# Patient Record
Sex: Male | Born: 1940 | Race: Black or African American | Hispanic: No | Marital: Single | Smoking: Never smoker
Health system: Southern US, Community
[De-identification: ages and names within clinical notes are randomized; demographics above are authoritative.]

## PROBLEM LIST (undated history)

## (undated) DIAGNOSIS — E872 Acidosis: Secondary | ICD-10-CM

## (undated) DIAGNOSIS — F1010 Alcohol abuse, uncomplicated: Secondary | ICD-10-CM

## (undated) DIAGNOSIS — I10 Essential (primary) hypertension: Secondary | ICD-10-CM

## (undated) DIAGNOSIS — E785 Hyperlipidemia, unspecified: Secondary | ICD-10-CM

## (undated) DIAGNOSIS — Z992 Dependence on renal dialysis: Secondary | ICD-10-CM

## (undated) DIAGNOSIS — Z933 Colostomy status: Secondary | ICD-10-CM

## (undated) DIAGNOSIS — K56609 Unspecified intestinal obstruction, unspecified as to partial versus complete obstruction: Secondary | ICD-10-CM

## (undated) DIAGNOSIS — K861 Other chronic pancreatitis: Secondary | ICD-10-CM

## (undated) HISTORY — DX: Morbid (severe) obesity due to excess calories: E66.01

## (undated) HISTORY — DX: Essential (primary) hypertension: I10

## (undated) HISTORY — PX: OTHER SURGICAL HISTORY: SHX169

## (undated) HISTORY — DX: Other chronic pancreatitis: K86.1

## (undated) HISTORY — PX: COLECTOMY: SHX59

## (undated) HISTORY — DX: Unspecified intestinal obstruction, unspecified as to partial versus complete obstruction: K56.609

## (undated) HISTORY — DX: Alcohol abuse, uncomplicated: F10.10

## (undated) HISTORY — DX: Colostomy status: Z93.3

## (undated) HISTORY — DX: Hyperlipidemia, unspecified: E78.5

---

## 2000-12-28 ENCOUNTER — Inpatient Hospital Stay (HOSPITAL_COMMUNITY): Admission: AD | Admit: 2000-12-28 | Discharge: 2001-01-24 | Payer: Self-pay | Admitting: Family Medicine

## 2000-12-29 ENCOUNTER — Encounter: Payer: Self-pay | Admitting: Family Medicine

## 2000-12-30 ENCOUNTER — Encounter: Payer: Self-pay | Admitting: General Surgery

## 2001-01-06 ENCOUNTER — Encounter: Payer: Self-pay | Admitting: Family Medicine

## 2001-01-10 ENCOUNTER — Encounter: Payer: Self-pay | Admitting: Family Medicine

## 2001-01-11 ENCOUNTER — Encounter: Payer: Self-pay | Admitting: General Surgery

## 2001-01-16 ENCOUNTER — Encounter: Payer: Self-pay | Admitting: Internal Medicine

## 2004-07-31 ENCOUNTER — Ambulatory Visit: Payer: Self-pay | Admitting: Family Medicine

## 2004-08-19 ENCOUNTER — Ambulatory Visit: Payer: Self-pay | Admitting: Family Medicine

## 2004-11-27 ENCOUNTER — Ambulatory Visit: Payer: Self-pay | Admitting: Family Medicine

## 2005-01-26 ENCOUNTER — Ambulatory Visit: Payer: Self-pay | Admitting: Family Medicine

## 2005-05-07 ENCOUNTER — Inpatient Hospital Stay (HOSPITAL_COMMUNITY): Admission: EM | Admit: 2005-05-07 | Discharge: 2005-05-13 | Payer: Self-pay | Admitting: Emergency Medicine

## 2005-05-07 ENCOUNTER — Ambulatory Visit: Payer: Self-pay | Admitting: *Deleted

## 2005-05-12 ENCOUNTER — Encounter (INDEPENDENT_AMBULATORY_CARE_PROVIDER_SITE_OTHER): Payer: Self-pay | Admitting: General Surgery

## 2005-06-11 ENCOUNTER — Ambulatory Visit: Payer: Self-pay | Admitting: Family Medicine

## 2005-09-14 ENCOUNTER — Ambulatory Visit: Payer: Self-pay | Admitting: Family Medicine

## 2005-10-19 ENCOUNTER — Ambulatory Visit: Payer: Self-pay | Admitting: Family Medicine

## 2006-02-11 ENCOUNTER — Ambulatory Visit: Payer: Self-pay | Admitting: Family Medicine

## 2006-05-26 ENCOUNTER — Ambulatory Visit: Payer: Self-pay | Admitting: Family Medicine

## 2006-06-30 ENCOUNTER — Ambulatory Visit: Payer: Self-pay | Admitting: Family Medicine

## 2006-08-11 ENCOUNTER — Ambulatory Visit: Payer: Self-pay | Admitting: Family Medicine

## 2006-09-08 ENCOUNTER — Encounter: Payer: Self-pay | Admitting: Family Medicine

## 2006-09-08 LAB — CONVERTED CEMR LAB
ALT: 11 units/L (ref 0–53)
AST: 12 units/L (ref 0–37)
Albumin: 4.3 g/dL (ref 3.5–5.2)
Alkaline Phosphatase: 37 units/L — ABNORMAL LOW (ref 39–117)
BUN: 28 mg/dL — ABNORMAL HIGH (ref 6–23)
Basophils Absolute: 0 10*3/uL (ref 0.0–0.1)
Basophils Relative: 0 % (ref 0–1)
Bilirubin, Direct: 0.1 mg/dL (ref 0.0–0.3)
CO2: 18 meq/L — ABNORMAL LOW (ref 19–32)
Calcium: 9 mg/dL (ref 8.4–10.5)
Chloride: 109 meq/L (ref 96–112)
Cholesterol: 174 mg/dL (ref 0–200)
Creatinine, Ser: 1.79 mg/dL — ABNORMAL HIGH (ref 0.40–1.50)
Eosinophils Absolute: 0.1 10*3/uL (ref 0.0–0.7)
Eosinophils Relative: 2 % (ref 0–5)
Glucose, Bld: 95 mg/dL (ref 70–99)
HCT: 34.9 % — ABNORMAL LOW (ref 39.0–52.0)
HDL: 51 mg/dL (ref >39)
Hemoglobin: 11.1 g/dL — ABNORMAL LOW (ref 13.0–17.0)
Indirect Bilirubin: 0.4 mg/dL (ref 0.0–0.9)
LDL Cholesterol: 93 mg/dL (ref 0–99)
Lymphocytes Relative: 29 % (ref 12–46)
Lymphs Abs: 1.7 10*3/uL (ref 0.7–3.3)
MCHC: 31.8 g/dL (ref 30.0–36.0)
MCV: 81.4 fL (ref 78.0–100.0)
Monocytes Absolute: 0.6 10*3/uL (ref 0.2–0.7)
Monocytes Relative: 10 % (ref 3–11)
Neutro Abs: 3.4 10*3/uL (ref 1.7–7.7)
Neutrophils Relative %: 59 % (ref 43–77)
Platelets: 256 10*3/uL (ref 150–400)
Potassium: 5.8 meq/L — ABNORMAL HIGH (ref 3.5–5.3)
RBC: 4.29 M/uL (ref 4.22–5.81)
RDW: 16.2 % — ABNORMAL HIGH (ref 11.5–14.0)
Sodium: 139 meq/L (ref 135–145)
TSH: 2.931 microintl units/mL (ref 0.350–5.50)
Total Bilirubin: 0.5 mg/dL (ref 0.3–1.2)
Total CHOL/HDL Ratio: 3.4
Total Protein: 7.1 g/dL (ref 6.0–8.3)
Triglycerides: 150 mg/dL — ABNORMAL HIGH (ref <150)
VLDL: 30 mg/dL (ref 0–40)
WBC: 5.8 10*3/uL (ref 4.0–10.5)

## 2006-12-06 ENCOUNTER — Ambulatory Visit: Payer: Self-pay | Admitting: Family Medicine

## 2006-12-06 LAB — CONVERTED CEMR LAB
ALT: 12 units/L (ref 0–53)
AST: 15 units/L (ref 0–37)
Albumin: 4.4 g/dL (ref 3.5–5.2)
Alkaline Phosphatase: 36 units/L — ABNORMAL LOW (ref 39–117)
BUN: 23 mg/dL (ref 6–23)
Basophils Absolute: 0 10*3/uL (ref 0.0–0.1)
Basophils Relative: 0 % (ref 0–1)
Bilirubin, Direct: 0.1 mg/dL (ref 0.0–0.3)
CO2: 20 meq/L (ref 19–32)
Calcium: 9.2 mg/dL (ref 8.4–10.5)
Chloride: 105 meq/L (ref 96–112)
Cholesterol: 210 mg/dL — ABNORMAL HIGH (ref 0–200)
Creatinine, Ser: 1.6 mg/dL — ABNORMAL HIGH (ref 0.40–1.50)
Eosinophils Absolute: 0.1 10*3/uL (ref 0.0–0.7)
Eosinophils Relative: 2 % (ref 0–5)
Glucose, Bld: 101 mg/dL — ABNORMAL HIGH (ref 70–99)
HCT: 35.1 % — ABNORMAL LOW (ref 39.0–52.0)
HDL: 57 mg/dL (ref >39)
Hemoglobin: 11.4 g/dL — ABNORMAL LOW (ref 13.0–17.0)
Indirect Bilirubin: 0.4 mg/dL (ref 0.0–0.9)
LDL Cholesterol: 115 mg/dL — ABNORMAL HIGH (ref 0–99)
Lymphocytes Relative: 20 % (ref 12–46)
Lymphs Abs: 1.2 10*3/uL (ref 0.7–3.3)
MCHC: 32.5 g/dL (ref 30.0–36.0)
MCV: 81.6 fL (ref 78.0–100.0)
Monocytes Absolute: 0.5 10*3/uL (ref 0.2–0.7)
Monocytes Relative: 9 % (ref 3–11)
Neutro Abs: 3.9 10*3/uL (ref 1.7–7.7)
Neutrophils Relative %: 69 % (ref 43–77)
Platelets: 234 10*3/uL (ref 150–400)
Potassium: 4.5 meq/L (ref 3.5–5.3)
RBC: 4.3 M/uL (ref 4.22–5.81)
RDW: 16.1 % — ABNORMAL HIGH (ref 11.5–14.0)
Sodium: 137 meq/L (ref 135–145)
TSH: 3.527 microintl units/mL (ref 0.350–5.50)
Total Bilirubin: 0.5 mg/dL (ref 0.3–1.2)
Total CHOL/HDL Ratio: 3.7
Total Protein: 7.3 g/dL (ref 6.0–8.3)
Triglycerides: 190 mg/dL — ABNORMAL HIGH (ref <150)
VLDL: 38 mg/dL (ref 0–40)
WBC: 5.6 10*3/uL (ref 4.0–10.5)

## 2006-12-07 ENCOUNTER — Ambulatory Visit (HOSPITAL_COMMUNITY): Admission: RE | Admit: 2006-12-07 | Discharge: 2006-12-07 | Payer: Self-pay | Admitting: Family Medicine

## 2007-03-24 ENCOUNTER — Ambulatory Visit: Payer: Self-pay | Admitting: Family Medicine

## 2007-05-24 ENCOUNTER — Encounter: Payer: Self-pay | Admitting: Family Medicine

## 2007-05-24 LAB — CONVERTED CEMR LAB
ALT: 8 units/L (ref 0–53)
AST: 12 units/L (ref 0–37)
Albumin: 4.3 g/dL (ref 3.5–5.2)
Alkaline Phosphatase: 36 units/L — ABNORMAL LOW (ref 39–117)
Bilirubin, Direct: 0.1 mg/dL (ref 0.0–0.3)
Cholesterol: 162 mg/dL (ref 0–200)
HDL: 44 mg/dL (ref >39)
Indirect Bilirubin: 0.7 mg/dL (ref 0.0–0.9)
LDL Cholesterol: 78 mg/dL (ref 0–99)
Total Bilirubin: 0.8 mg/dL (ref 0.3–1.2)
Total CHOL/HDL Ratio: 3.7
Total Protein: 7.2 g/dL (ref 6.0–8.3)
Triglycerides: 200 mg/dL — ABNORMAL HIGH (ref <150)
VLDL: 40 mg/dL (ref 0–40)

## 2007-06-01 ENCOUNTER — Ambulatory Visit: Payer: Self-pay | Admitting: Family Medicine

## 2007-07-23 ENCOUNTER — Inpatient Hospital Stay (HOSPITAL_COMMUNITY): Admission: EM | Admit: 2007-07-23 | Discharge: 2007-07-26 | Payer: Self-pay | Admitting: Emergency Medicine

## 2007-08-01 ENCOUNTER — Inpatient Hospital Stay (HOSPITAL_COMMUNITY): Admission: EM | Admit: 2007-08-01 | Discharge: 2007-08-05 | Payer: Self-pay | Admitting: Emergency Medicine

## 2007-08-04 ENCOUNTER — Encounter: Payer: Self-pay | Admitting: Family Medicine

## 2007-08-31 ENCOUNTER — Ambulatory Visit: Payer: Self-pay | Admitting: Family Medicine

## 2007-08-31 LAB — CONVERTED CEMR LAB
BUN: 22 mg/dL (ref 6–23)
CO2: 19 meq/L (ref 19–32)
Calcium: 9.5 mg/dL (ref 8.4–10.5)
Chloride: 108 meq/L (ref 96–112)
Creatinine, Ser: 1.6 mg/dL — ABNORMAL HIGH (ref 0.40–1.50)
Glucose, Bld: 102 mg/dL — ABNORMAL HIGH (ref 70–99)
PSA: 1.13 ng/mL (ref 0.10–4.00)
Potassium: 5.3 meq/L (ref 3.5–5.3)
Sodium: 140 meq/L (ref 135–145)

## 2007-10-31 ENCOUNTER — Ambulatory Visit: Payer: Self-pay | Admitting: Family Medicine

## 2007-10-31 LAB — CONVERTED CEMR LAB
ALT: 8 units/L (ref 0–53)
AST: 10 units/L (ref 0–37)
Albumin: 4.3 g/dL (ref 3.5–5.2)
Alkaline Phosphatase: 40 units/L (ref 39–117)
BUN: 29 mg/dL — ABNORMAL HIGH (ref 6–23)
Basophils Absolute: 0 10*3/uL (ref 0.0–0.1)
Basophils Relative: 0 % (ref 0–1)
Bilirubin, Direct: 0.1 mg/dL (ref 0.0–0.3)
CO2: 18 meq/L — ABNORMAL LOW (ref 19–32)
Calcium: 9.2 mg/dL (ref 8.4–10.5)
Chloride: 107 meq/L (ref 96–112)
Cholesterol: 206 mg/dL — ABNORMAL HIGH (ref 0–200)
Creatinine, Ser: 1.92 mg/dL — ABNORMAL HIGH (ref 0.40–1.50)
Eosinophils Absolute: 0.1 10*3/uL (ref 0.0–0.7)
Eosinophils Relative: 2 % (ref 0–5)
Glucose, Bld: 102 mg/dL — ABNORMAL HIGH (ref 70–99)
HCT: 34.5 % — ABNORMAL LOW (ref 39.0–52.0)
HDL: 41 mg/dL (ref >39)
Hemoglobin: 11.3 g/dL — ABNORMAL LOW (ref 13.0–17.0)
Indirect Bilirubin: 0.5 mg/dL (ref 0.0–0.9)
LDL Cholesterol: 146 mg/dL — ABNORMAL HIGH (ref 0–99)
Lymphocytes Relative: 25 % (ref 12–46)
Lymphs Abs: 1.5 10*3/uL (ref 0.7–4.0)
MCHC: 32.8 g/dL (ref 30.0–36.0)
MCV: 80.8 fL (ref 78.0–100.0)
Monocytes Absolute: 0.5 10*3/uL (ref 0.1–1.0)
Monocytes Relative: 9 % (ref 3–12)
Neutro Abs: 3.8 10*3/uL (ref 1.7–7.7)
Neutrophils Relative %: 64 % (ref 43–77)
Platelets: 263 10*3/uL (ref 150–400)
Potassium: 5.3 meq/L (ref 3.5–5.3)
RBC: 4.27 M/uL (ref 4.22–5.81)
RDW: 15.8 % — ABNORMAL HIGH (ref 11.5–15.5)
Sodium: 138 meq/L (ref 135–145)
Total Bilirubin: 0.6 mg/dL (ref 0.3–1.2)
Total CHOL/HDL Ratio: 5
Total Protein: 7.2 g/dL (ref 6.0–8.3)
Triglycerides: 94 mg/dL (ref <150)
VLDL: 19 mg/dL (ref 0–40)
WBC: 5.9 10*3/uL (ref 4.0–10.5)

## 2007-11-01 DIAGNOSIS — K56609 Unspecified intestinal obstruction, unspecified as to partial versus complete obstruction: Secondary | ICD-10-CM | POA: Insufficient documentation

## 2007-11-01 DIAGNOSIS — I10 Essential (primary) hypertension: Secondary | ICD-10-CM | POA: Insufficient documentation

## 2007-11-01 DIAGNOSIS — K861 Other chronic pancreatitis: Secondary | ICD-10-CM | POA: Insufficient documentation

## 2007-11-01 DIAGNOSIS — E785 Hyperlipidemia, unspecified: Secondary | ICD-10-CM

## 2007-11-01 DIAGNOSIS — F1021 Alcohol dependence, in remission: Secondary | ICD-10-CM

## 2007-12-20 ENCOUNTER — Ambulatory Visit: Payer: Self-pay | Admitting: Family Medicine

## 2007-12-20 LAB — CONVERTED CEMR LAB
Basophils Absolute: 0 10*3/uL (ref 0.0–0.1)
Basophils Relative: 0 % (ref 0–1)
Eosinophils Absolute: 0.3 10*3/uL (ref 0.0–0.7)
Eosinophils Relative: 3 % (ref 0–5)
HCT: 33.4 % — ABNORMAL LOW (ref 39.0–52.0)
Hemoglobin: 10.8 g/dL — ABNORMAL LOW (ref 13.0–17.0)
Lymphocytes Relative: 25 % (ref 12–46)
Lymphs Abs: 2.3 10*3/uL (ref 0.7–4.0)
MCHC: 32.3 g/dL (ref 30.0–36.0)
MCV: 80.1 fL (ref 78.0–100.0)
Monocytes Absolute: 0.7 10*3/uL (ref 0.1–1.0)
Monocytes Relative: 7 % (ref 3–12)
Neutro Abs: 5.9 10*3/uL (ref 1.7–7.7)
Neutrophils Relative %: 64 % (ref 43–77)
Platelets: 369 10*3/uL (ref 150–400)
RBC: 4.17 M/uL — ABNORMAL LOW (ref 4.22–5.81)
RDW: 15.4 % (ref 11.5–15.5)
WBC: 9.2 10*3/uL (ref 4.0–10.5)

## 2008-02-13 ENCOUNTER — Ambulatory Visit: Payer: Self-pay | Admitting: Family Medicine

## 2008-02-13 LAB — CONVERTED CEMR LAB
ALT: 9 units/L (ref 0–53)
AST: 10 units/L (ref 0–37)
Albumin: 4.3 g/dL (ref 3.5–5.2)
Alkaline Phosphatase: 42 units/L (ref 39–117)
BUN: 30 mg/dL — ABNORMAL HIGH (ref 6–23)
Bilirubin, Direct: 0.1 mg/dL (ref 0.0–0.3)
CO2: 18 meq/L — ABNORMAL LOW (ref 19–32)
Calcium: 9 mg/dL (ref 8.4–10.5)
Chloride: 105 meq/L (ref 96–112)
Cholesterol: 174 mg/dL (ref 0–200)
Creatinine, Ser: 1.94 mg/dL — ABNORMAL HIGH (ref 0.40–1.50)
Glucose, Bld: 101 mg/dL — ABNORMAL HIGH (ref 70–99)
HDL: 50 mg/dL (ref >39)
Indirect Bilirubin: 0.5 mg/dL (ref 0.0–0.9)
LDL Cholesterol: 106 mg/dL — ABNORMAL HIGH (ref 0–99)
Potassium: 4.9 meq/L (ref 3.5–5.3)
Sodium: 137 meq/L (ref 135–145)
Total Bilirubin: 0.6 mg/dL (ref 0.3–1.2)
Total CHOL/HDL Ratio: 3.5
Total Protein: 7.1 g/dL (ref 6.0–8.3)
Triglycerides: 91 mg/dL (ref <150)
VLDL: 18 mg/dL (ref 0–40)

## 2008-05-31 ENCOUNTER — Ambulatory Visit: Payer: Self-pay | Admitting: Family Medicine

## 2008-05-31 DIAGNOSIS — S0000XA Unspecified superficial injury of scalp, initial encounter: Secondary | ICD-10-CM

## 2008-05-31 DIAGNOSIS — L089 Local infection of the skin and subcutaneous tissue, unspecified: Secondary | ICD-10-CM | POA: Insufficient documentation

## 2008-05-31 DIAGNOSIS — S0080XA Unspecified superficial injury of other part of head, initial encounter: Secondary | ICD-10-CM

## 2008-05-31 DIAGNOSIS — L0390 Cellulitis, unspecified: Secondary | ICD-10-CM

## 2008-05-31 DIAGNOSIS — S1090XA Unspecified superficial injury of unspecified part of neck, initial encounter: Secondary | ICD-10-CM

## 2008-05-31 DIAGNOSIS — L0291 Cutaneous abscess, unspecified: Secondary | ICD-10-CM

## 2008-05-31 LAB — CONVERTED CEMR LAB
ALT: 8 units/L (ref 0–53)
AST: 13 units/L (ref 0–37)
Albumin: 4.3 g/dL (ref 3.5–5.2)
Alkaline Phosphatase: 41 units/L (ref 39–117)
BUN: 29 mg/dL — ABNORMAL HIGH (ref 6–23)
Basophils Absolute: 0 10*3/uL (ref 0.0–0.1)
Basophils Relative: 0 % (ref 0–1)
Bilirubin, Direct: 0.1 mg/dL (ref 0.0–0.3)
CO2: 18 meq/L — ABNORMAL LOW (ref 19–32)
Calcium: 8.9 mg/dL (ref 8.4–10.5)
Chloride: 111 meq/L (ref 96–112)
Cholesterol: 143 mg/dL (ref 0–200)
Creatinine, Ser: 2.31 mg/dL — ABNORMAL HIGH (ref 0.40–1.50)
Eosinophils Absolute: 0.1 10*3/uL (ref 0.0–0.7)
Eosinophils Relative: 2 % (ref 0–5)
Glucose, Bld: 104 mg/dL — ABNORMAL HIGH (ref 70–99)
HCT: 31.6 % — ABNORMAL LOW (ref 39.0–52.0)
HDL: 44 mg/dL (ref >39)
Hemoglobin: 10.3 g/dL — ABNORMAL LOW (ref 13.0–17.0)
Indirect Bilirubin: 0.4 mg/dL (ref 0.0–0.9)
LDL Cholesterol: 83 mg/dL (ref 0–99)
Lymphocytes Relative: 23 % (ref 12–46)
Lymphs Abs: 1.5 10*3/uL (ref 0.7–4.0)
MCHC: 32.6 g/dL (ref 30.0–36.0)
MCV: 79.8 fL (ref 78.0–100.0)
Monocytes Absolute: 0.6 10*3/uL (ref 0.1–1.0)
Monocytes Relative: 9 % (ref 3–12)
Neutro Abs: 4.2 10*3/uL (ref 1.7–7.7)
Neutrophils Relative %: 66 % (ref 43–77)
Platelets: 264 10*3/uL (ref 150–400)
Potassium: 5.2 meq/L (ref 3.5–5.3)
RBC: 3.96 M/uL — ABNORMAL LOW (ref 4.22–5.81)
RDW: 16.2 % — ABNORMAL HIGH (ref 11.5–15.5)
Sodium: 141 meq/L (ref 135–145)
Total Bilirubin: 0.5 mg/dL (ref 0.3–1.2)
Total CHOL/HDL Ratio: 3.3
Total Protein: 6.9 g/dL (ref 6.0–8.3)
Triglycerides: 82 mg/dL (ref <150)
VLDL: 16 mg/dL (ref 0–40)
WBC: 6.4 10*3/uL (ref 4.0–10.5)

## 2008-10-15 ENCOUNTER — Ambulatory Visit: Payer: Self-pay | Admitting: Family Medicine

## 2008-10-16 LAB — CONVERTED CEMR LAB
ALT: 8 units/L (ref 0–53)
AST: 12 units/L (ref 0–37)
Albumin: 4.4 g/dL (ref 3.5–5.2)
Alkaline Phosphatase: 44 units/L (ref 39–117)
BUN: 39 mg/dL — ABNORMAL HIGH (ref 6–23)
Bilirubin, Direct: 0.1 mg/dL (ref 0.0–0.3)
CO2: 18 meq/L — ABNORMAL LOW (ref 19–32)
Calcium: 9.4 mg/dL (ref 8.4–10.5)
Chloride: 108 meq/L (ref 96–112)
Cholesterol: 182 mg/dL (ref 0–200)
Creatinine, Ser: 3.14 mg/dL — ABNORMAL HIGH (ref 0.40–1.50)
Glucose, Bld: 102 mg/dL — ABNORMAL HIGH (ref 70–99)
HDL: 43 mg/dL (ref >39)
Indirect Bilirubin: 0.4 mg/dL (ref 0.0–0.9)
LDL Cholesterol: 116 mg/dL — ABNORMAL HIGH (ref 0–99)
PSA: 1.03 ng/mL (ref 0.10–4.00)
Potassium: 5.3 meq/L (ref 3.5–5.3)
Sodium: 139 meq/L (ref 135–145)
Total Bilirubin: 0.5 mg/dL (ref 0.3–1.2)
Total CHOL/HDL Ratio: 4.2
Total Protein: 7.4 g/dL (ref 6.0–8.3)
Triglycerides: 115 mg/dL (ref <150)
VLDL: 23 mg/dL (ref 0–40)

## 2008-11-20 ENCOUNTER — Encounter: Payer: Self-pay | Admitting: Family Medicine

## 2008-12-27 ENCOUNTER — Encounter: Payer: Self-pay | Admitting: Family Medicine

## 2009-01-15 ENCOUNTER — Ambulatory Visit: Payer: Self-pay | Admitting: Family Medicine

## 2009-01-29 ENCOUNTER — Encounter: Payer: Self-pay | Admitting: Family Medicine

## 2009-02-15 ENCOUNTER — Ambulatory Visit: Payer: Self-pay | Admitting: Family Medicine

## 2009-02-18 LAB — CONVERTED CEMR LAB
BUN: 38 mg/dL — ABNORMAL HIGH (ref 6–23)
CO2: 18 meq/L — ABNORMAL LOW (ref 19–32)
Calcium: 9.4 mg/dL (ref 8.4–10.5)
Chloride: 110 meq/L (ref 96–112)
Creatinine, Ser: 3.2 mg/dL — ABNORMAL HIGH (ref 0.40–1.50)
Glucose, Bld: 99 mg/dL (ref 70–99)
Potassium: 5.6 meq/L — ABNORMAL HIGH (ref 3.5–5.3)
Sodium: 141 meq/L (ref 135–145)
TSH: 2.931 microintl units/mL (ref 0.350–4.500)

## 2009-05-22 ENCOUNTER — Ambulatory Visit: Payer: Self-pay | Admitting: Family Medicine

## 2009-05-22 LAB — CONVERTED CEMR LAB
ALT: <8 units/L (ref 0–53)
AST: 13 units/L (ref 0–37)
Albumin: 4.2 g/dL (ref 3.5–5.2)
Alkaline Phosphatase: 42 units/L (ref 39–117)
Bilirubin, Direct: 0.1 mg/dL (ref 0.0–0.3)
Cholesterol: 189 mg/dL (ref 0–200)
HDL: 47 mg/dL (ref >39)
Indirect Bilirubin: 0.4 mg/dL (ref 0.0–0.9)
LDL Cholesterol: 113 mg/dL — ABNORMAL HIGH (ref 0–99)
Total Bilirubin: 0.5 mg/dL (ref 0.3–1.2)
Total CHOL/HDL Ratio: 4
Total Protein: 6.8 g/dL (ref 6.0–8.3)
Triglycerides: 147 mg/dL (ref <150)
Uric Acid, Serum: 8.8 mg/dL — ABNORMAL HIGH (ref 4.0–7.8)
VLDL: 29 mg/dL (ref 0–40)

## 2009-05-31 ENCOUNTER — Encounter: Payer: Self-pay | Admitting: Family Medicine

## 2009-08-27 ENCOUNTER — Ambulatory Visit: Payer: Self-pay | Admitting: Family Medicine

## 2009-08-27 DIAGNOSIS — J309 Allergic rhinitis, unspecified: Secondary | ICD-10-CM | POA: Insufficient documentation

## 2009-08-28 DIAGNOSIS — R7989 Other specified abnormal findings of blood chemistry: Secondary | ICD-10-CM | POA: Insufficient documentation

## 2009-08-28 LAB — CONVERTED CEMR LAB
Cholesterol: 173 mg/dL (ref 0–200)
HDL: 37 mg/dL — ABNORMAL LOW (ref >39)
LDL Cholesterol: 102 mg/dL — ABNORMAL HIGH (ref 0–99)
Total CHOL/HDL Ratio: 4.7
Triglycerides: 172 mg/dL — ABNORMAL HIGH (ref <150)
Uric Acid, Serum: 11.2 mg/dL — ABNORMAL HIGH (ref 4.0–7.8)
VLDL: 34 mg/dL (ref 0–40)

## 2009-12-23 ENCOUNTER — Encounter: Payer: Self-pay | Admitting: Family Medicine

## 2009-12-26 ENCOUNTER — Ambulatory Visit: Payer: Self-pay | Admitting: Family Medicine

## 2009-12-26 DIAGNOSIS — R5381 Other malaise: Secondary | ICD-10-CM | POA: Insufficient documentation

## 2009-12-26 DIAGNOSIS — R5383 Other fatigue: Secondary | ICD-10-CM

## 2009-12-27 ENCOUNTER — Encounter: Payer: Self-pay | Admitting: Family Medicine

## 2009-12-27 LAB — CONVERTED CEMR LAB: Hgb A1c MFr Bld: 6.2 % — ABNORMAL HIGH (ref <5.7)

## 2010-03-27 ENCOUNTER — Encounter (HOSPITAL_COMMUNITY): Admission: RE | Admit: 2010-03-27 | Discharge: 2010-04-26 | Payer: Self-pay | Admitting: Nephrology

## 2010-03-27 ENCOUNTER — Ambulatory Visit (HOSPITAL_COMMUNITY): Payer: Self-pay | Admitting: Nephrology

## 2010-05-09 ENCOUNTER — Encounter (HOSPITAL_COMMUNITY)
Admission: RE | Admit: 2010-05-09 | Discharge: 2010-06-08 | Payer: Self-pay | Source: Home / Self Care | Admitting: Nephrology

## 2010-05-22 ENCOUNTER — Ambulatory Visit (HOSPITAL_COMMUNITY): Payer: Self-pay | Admitting: Nephrology

## 2010-06-04 ENCOUNTER — Ambulatory Visit: Payer: Self-pay | Admitting: Family Medicine

## 2010-06-04 LAB — CONVERTED CEMR LAB: Retic Ct Pct: 1.6 % (ref 0.4–3.1)

## 2010-06-16 ENCOUNTER — Encounter: Payer: Self-pay | Admitting: Family Medicine

## 2010-06-19 ENCOUNTER — Encounter (HOSPITAL_COMMUNITY)
Admission: RE | Admit: 2010-06-19 | Discharge: 2010-07-19 | Payer: Self-pay | Source: Home / Self Care | Attending: Nephrology | Admitting: Nephrology

## 2010-06-24 ENCOUNTER — Encounter: Payer: Self-pay | Admitting: Family Medicine

## 2010-06-30 ENCOUNTER — Telehealth: Payer: Self-pay | Admitting: Family Medicine

## 2010-06-30 DIAGNOSIS — I83009 Varicose veins of unspecified lower extremity with ulcer of unspecified site: Secondary | ICD-10-CM

## 2010-06-30 DIAGNOSIS — L97909 Non-pressure chronic ulcer of unspecified part of unspecified lower leg with unspecified severity: Secondary | ICD-10-CM

## 2010-07-03 ENCOUNTER — Ambulatory Visit: Payer: Self-pay | Admitting: Family Medicine

## 2010-07-04 ENCOUNTER — Encounter: Payer: Self-pay | Admitting: Family Medicine

## 2010-07-07 ENCOUNTER — Ambulatory Visit: Payer: Self-pay | Admitting: Family Medicine

## 2010-07-08 ENCOUNTER — Encounter: Payer: Self-pay | Admitting: Family Medicine

## 2010-07-16 ENCOUNTER — Encounter: Payer: Self-pay | Admitting: Family Medicine

## 2010-07-17 ENCOUNTER — Ambulatory Visit (HOSPITAL_COMMUNITY): Payer: Self-pay | Admitting: Nephrology

## 2010-07-23 ENCOUNTER — Encounter: Payer: Self-pay | Admitting: Family Medicine

## 2010-07-25 ENCOUNTER — Encounter: Payer: Self-pay | Admitting: Family Medicine

## 2010-08-07 ENCOUNTER — Encounter: Payer: Self-pay | Admitting: Family Medicine

## 2010-08-08 ENCOUNTER — Encounter (HOSPITAL_COMMUNITY)
Admission: RE | Admit: 2010-08-08 | Discharge: 2010-09-02 | Payer: Self-pay | Source: Home / Self Care | Attending: Nephrology | Admitting: Nephrology

## 2010-08-21 ENCOUNTER — Ambulatory Visit
Admission: RE | Admit: 2010-08-21 | Discharge: 2010-08-21 | Payer: Self-pay | Source: Home / Self Care | Attending: Vascular Surgery | Admitting: Vascular Surgery

## 2010-08-21 ENCOUNTER — Ambulatory Visit: Admit: 2010-08-21 | Payer: Self-pay | Admitting: Vascular Surgery

## 2010-08-22 NOTE — Assessment & Plan Note (Signed)
OFFICE VISIT  [name] Snow, [name] Snow DOB:  02/04/1941                                       08/21/2010 CHART#:[id number]  CHIEF COMPLAINT:  Needs hemodialysis access.  HISTORY OF PRESENT ILLNESS:  The patient is a 70 year old male referred by Dr. Lowanda Foster for placement of long-term hemodialysis access.  The patient currently is not on hemodialysis.  The patient is right-handed. The patient has had no prior access procedures.  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 Lowanda Foster and the patient's primary care doctor, Dr. Tula Nakayama.  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  Most recent serum creatinine from December of 2011 was 4.2.  PAST MEDICAL HISTORY:  As listed above.  PAST SURGICAL HISTORY:  He has a colostomy but neither the patient nor his sister knew exactly why that had been created; his sister did not think it was from cancer.  Most of the history was obtained from the sister as the patient himself was not able to provide much medical history.  SOCIAL HISTORY:  He is single.  He is retired.  He has no children.  He is a nonsmoker.  Alcohol history is as listed above.  FAMILY HISTORY:  Not remarkable for vascular disease or end-stage renal disease or diabetes at an early age.  Full 12 point review of systems was performed on the patient.  This was remarkable for his weight of 328 pounds.  He has had some decline in his hearing and you have to speak loudly for him to hear. HEMATOLOGIC:  He has history of anemia. CARDIAC:  He has some shortness of breath when lying flat or with exertion. PULMONARY:  He has occasional wheezing. SKIN:  He has  chronic ulcerations from lymphedema in both lower extremities. All other systems were negative.  MEDICATIONS: 1. Simvastatin 80 mg q.h.s. 2. Torsemide 20 mg 2 daily. 3. Diovan/hydrochlorothiazide 300/25 once daily. 4. Allopurinol 300 once daily. 5. Iron 65 mg twice a day. 6. Neupogen 10,000 units every 2 weeks.  ALLERGIES:  He has no known drug allergies.  PHYSICAL EXAM:  Vital signs:  Blood pressure is 119/77 in the left arm, heart rate 76 and regular, oxygen saturation 100% on room air.  HEENT: Unremarkable.  Neck:  Has 2+ carotid pulses without bruit.  Chest: Clear to auscultation.  Cardiac:  Regular rate and rhythm without murmur.  Abdomen:  Is very obese, soft, nontender, nondistended with a right lower quadrant ostomy.  Extremities:  He has chronic lymphedema changes and ulcerations in both pretibial regions of the legs.  Upper extremities, he has 2+ brachial and radial pulses bilaterally.  Skin: Has open ulcers as mentioned above.  Musculoskeletal:  Shows no major obvious joint deformities.  Neurological:  The patient is slow to answer some questions.  He is able to follow commands.  Upper extremity and lower extremity motor strength is 4/5 bilaterally.  He had a vein mapping ultrasound today which shows the cephalic vein is tortuous in both arms but greater than 3 mm in diameter in both upper extremities.  The cephalic vein, however, on the right side is fairly  short and empties into the deep brachial vein very quickly.  The basilic veins are small bilaterally.  In summary, I believe the best option for the patient at this point will be placement of a left brachiocephalic AV fistula.  The forearm cephalic vein is too small.  He does have a very obese upper arm and I did explain to the patient and his sister today that this may require a staged operation with superficialization of the fistula later if it matures and it is too deep to cannulate.  We did not schedule him  for a fistula today as the sister did not know what day she will be available to bring him for transportation.  She will call our office back if she wishes to schedule him for his left brachiocephalic fistula in the near future.    Jessy Oto. Yashvi Jasinski, MD Electronically Signed  CEF/MEDQ  D:  08/21/2010  T:  08/22/2010  Job:  [id number]  cc:   Alison Murray, M.D.

## 2010-08-25 LAB — RENAL FUNCTION PANEL
Albumin: 3.6 g/dL (ref 3.5–5.2)
BUN: 50 mg/dL — ABNORMAL HIGH (ref 6–23)
CO2: 17 mEq/L — ABNORMAL LOW (ref 19–32)
Calcium: 9 mg/dL (ref 8.4–10.5)
Chloride: 109 mEq/L (ref 96–112)
Creatinine, Ser: 3.56 mg/dL — ABNORMAL HIGH (ref 0.4–1.5)
GFR calc Af Amer: 21 mL/min — ABNORMAL LOW (ref >60)
GFR calc non Af Amer: 17 mL/min — ABNORMAL LOW (ref >60)
Glucose, Bld: 86 mg/dL (ref 70–99)
Phosphorus: 3.8 mg/dL (ref 2.3–4.6)
Potassium: 4.8 mEq/L (ref 3.5–5.1)
Sodium: 135 mEq/L (ref 135–145)

## 2010-08-25 LAB — HEMOGLOBIN AND HEMATOCRIT, BLOOD
HCT: 33.3 % — ABNORMAL LOW (ref 39.0–52.0)
Hemoglobin: 11 g/dL — ABNORMAL LOW (ref 13.0–17.0)

## 2010-08-29 NOTE — Procedures (Unsigned)
CEPHALIC VEIN MAPPING  INDICATION:  Preop vein map for fistula.  HISTORY: Chronic kidney disease.  EXAM: The right cephalic vein is compressible.  Diameter measurements range from 0.40 to 0.11 cm.  The right basilic vein is compressible.  Diameter measurements range from 0.32 to 0.15 cm.  The left cephalic vein is compressible.  Diameter measurements range from 0.36 to 0.22 cm.  The left basilic vein is compressible.  Diameter measurements range from 0.30 to 0.14 cm.  See attached worksheet for all measurements and details.  IMPRESSION:  Patent bilateral cephalic and basilic veins with Diameter measurements as described above.  ___________________________________________ Jessy Oto. Fields, MD  LT/MEDQ  D:  08/21/2010  T:  08/21/2010  Job:  [id number]

## 2010-08-31 LAB — CONVERTED CEMR LAB: Retic Ct Pct: 1.4 % (ref 0.4–3.1)

## 2010-09-02 NOTE — Letter (Signed)
Summary: labs  labs   Imported By: Eliezer Mccoy 12/31/2009 15:08:35  _____________________________________________________________________  External Attachment:    Type:   Image     Comment:   External Document

## 2010-09-02 NOTE — Miscellaneous (Signed)
Summary: Home Care Report  Home Care Report   Imported By: Dierdre Harness 06/16/2010 11:27:39  _____________________________________________________________________  External Attachment:    Type:   Image     Comment:   External Document

## 2010-09-02 NOTE — Letter (Signed)
Summary: misc  misc   Imported By: Eliezer Mccoy 12/31/2009 15:16:11  _____________________________________________________________________  External Attachment:    Type:   Image     Comment:   External Document

## 2010-09-02 NOTE — Letter (Signed)
Summary: demographic  demographic   Imported By: Eliezer Mccoy 12/31/2009 15:02:56  _____________________________________________________________________  External Attachment:    Type:   Image     Comment:   External Document

## 2010-09-02 NOTE — Progress Notes (Signed)
Summary: [facility] / UNNA BOOTS  Phone Note Call from Patient   Summary of Call: [facility] CALLED AND SAID THTA HE HAS UNNA BOOTS AND WANTS TO KNOW CAN HE TAKE THEM OFF FOR A LITTLE WHILE CALL BACK AT [phone] Initial call taken by: Dierdre Harness,  June 30, 2010 9:38 AM  Follow-up for Phone Call        this pt needs to see dr Nils Pyle a vascular surgeon  to evaluate and treat the patient's leg ulcers, pls let homehealth nurse as well as his sister know this is the direction i am taking , we had discussed this before,hopefully his sister agrees, i am entering in referral box Follow-up by: Tula Nakayama MD,  June 30, 2010 5:13 PM  Additional Follow-up for Phone Call Additional follow up Details #1::        Jeannetta Nap states that the ulcer have healed from his legs and that is why she was requesting the boots to be removed, or atleast leave the off for a little bit so she could see how they did.  There is no open areas on his legs, and she states that she did leave the boots off a little yesterday.  She also stated that his legs look great. 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

## 2010-09-02 NOTE — Letter (Signed)
Summary: phone notes  phone notes   Imported By: Eliezer Mccoy 12/31/2009 16:00:25  _____________________________________________________________________  External Attachment:    Type:   Image     Comment:   External Document

## 2010-09-02 NOTE — Letter (Signed)
Summary: history and physical  history and physical   Imported By: Eliezer Mccoy 12/31/2009 15:07:01  _____________________________________________________________________  External Attachment:    Type:   Image     Comment:   External Document

## 2010-09-02 NOTE — Miscellaneous (Signed)
Summary: Home Care Report  Home Care Report   Imported By: Dierdre Harness 07/03/2010 08:08:07  _____________________________________________________________________  External Attachment:    Type:   Image     Comment:   External Document

## 2010-09-02 NOTE — Letter (Signed)
Summary: OSTOMY SUPPLIES  OSTOMY SUPPLIES   Imported By: Dierdre Harness 12/23/2009 09:01:53  _____________________________________________________________________  External Attachment:    Type:   Image     Comment:   External Document

## 2010-09-02 NOTE — Assessment & Plan Note (Signed)
Summary: office visit   Vital Signs:  Patient profile:   69 year old male Height:      64.5 inches Weight:      323.50 pounds BMI:     54.87 O2 Sat:      95 % Pulse rate:   78 / minute Pulse rhythm:   regular Resp:     18 per minute BP sitting:   130 / 74  (left arm) Cuff size:   xl  Vitals Entered By: Kate Sable LPN (May 26, 624THL 579FGE AM)  Nutrition Counseling: Patient's BMI is greater than 25 and therefore counseled on weight management options. CC: Follow up chronic problems   CC:  Follow up chronic problems.  History of Present Illness: Reports  that  he has been doing fairly well.He denies any  new healthconcerns His sister remains concerned about his continued weightgain, adn expreses the need to have assistance with transporting him.The pt remains unconcerned and continues to gain weight. Denies recent fever or chills. Denies sinus pressure, nasal congestion , ear pain or sore throat. Denies chest congestion, or cough productive of sputum. Denies chest pain, palpitations, PND, orthopnea or leg swelling. Denies abdominal pain, nausea, vomitting, diarrhea or constipation. Denies change in bowel movements or bloody stool. Denies dysuria , frequency, incontinence or hReports joint pain and stifness affecting back , hiips ad knees , and his limited mobility and is actually in a wheelchair because of this. his gait has been unsteady for some time. He denies any falls.Denies  joint pain, swelling, or reduced mobility. Denies headaches, vertigo, seizures. Denies depression, anxiety or insomnia. Denies  rash, lesions, or itch.     Current Medications (verified): 1)  Diovan Hct 320-25 Mg Tabs (Valsartan-Hydrochlorothiazide) .... Take 1 Tablet By Mouth Once A Day 2)  Simvastatin 80 Mg Tabs (Simvastatin) .... Take One Tab By Mouth At Bedtime 3)  Clonidine Hcl 0.2 Mg Tabs (Clonidine Hcl) .... Take 1 Tab By Mouth At Bedtime 4)  Allopurinol 300 Mg Tabs (Allopurinol) .... Take 1  Tablet By Mouth Two Times A Day 5)  Cvs Iron 325 (65 Fe) Mg Tabs (Ferrous Sulfate) .... Take 1 Tablet By Mouth Once A Day 6)  Calcitriol 0.25 Mcg Caps (Calcitriol) .... Take 1 Tablet By Mouth Once A Day 7)  Torsemide 20 Mg Tabs (Torsemide) .... 2 Tabs Once Daily  Allergies (verified): No Known Drug Allergies  Review of Systems      See HPI Eyes:  Denies blurring and discharge. CV:  Complains of shortness of breath with exertion and swelling of feet. MS:  Complains of joint pain, loss of strength, low back pain, mid back pain, muscle weakness, and stiffness. Derm:  Complains of itching, lesion(s), and rash; chronicsevere statsisdermatitis of lower ext with maculopapular lesions. Neuro:  Complains of poor balance and weakness. Psych:  Denies anxiety and depression. Endo:  Denies excessive thirst and excessive urination. Heme:  Denies abnormal bruising and bleeding. Allergy:  Denies hives or rash and itching eyes.  Physical Exam  General:  Well-developed,morbidly obese,in no acute distress; alert,appropriate and cooperative throughout examination HEENT: No facial asymmetry,  EOMI, No sinus tenderness, TM's Clear, oropharynx  pink and moist.   Chest: Clear to auscultation bilaterally.  CVS: S1, S2, No murmurs, No S3.   Abd: Soft, Nontender. Obese, colostomy site has no erythema or drainage, colostomy is working. MS: decreased  ROM spine, hips, shoulders and knees.  Ext: one plus edema.   CNS: CN 2-12 intact, power tone  and sensation normal throughout.   Skin: Intact, hyperpigmented lesions on ant legs, no drainage Psych: Good eye contact, normal affect.  Memory loss, not anxious or depressed appearing.    Impression & Recommendations:  Problem # 1:  HYPERLIPIDEMIA ([id number]) Assessment Comment Only  His updated medication list for this problem includes:    Simvastatin 80 Mg Tabs (Simvastatin) .Marland Kitchen... Take one tab by mouth at bedtime  Orders: T-Hepatic Function  [id number]) T-Lipid Profile [phone])  Labs Reviewed: SGOT: 13 (05/22/2009)   SGPT: <8 U/L (05/22/2009)   HDL:37 (08/27/2009), 47 (05/22/2009)  LDL:102 (08/27/2009), 113 (05/22/2009)  Chol:173 (08/27/2009), 189 (05/22/2009)  Trig:172 (08/27/2009), 147 (05/22/2009)  Problem # 2:  OBESITY, MORBID (ICD-278.01) Assessment: Deteriorated  Ht: 64.5 (12/26/2009)   Wt: 323.50 (12/26/2009)   BMI: 54.87 (12/26/2009)pt counselled re the importance of reducing caloric intake to facilitate weight loss  Problem # 3:  HYPERTENSION (ICD-401.9) Assessment: Unchanged  Reviewed preventive care protocols, scheduled due services, and updated immunizations.  His updated medication list for this problem includes:    Diovan Hct 320-25 Mg Tabs (Valsartan-hydrochlorothiazide) .Marland Kitchen... Take 1 tablet by mouth once a day    Clonidine Hcl 0.2 Mg Tabs (Clonidine hcl) .Marland Kitchen... Take 1 tab by mouth at bedtime    Torsemide 20 Mg Tabs (Torsemide) .Marland Kitchen... 2 tabs once daily  BP today: 130/74 Prior BP: 120/70 (08/27/2009)  Labs Reviewed: K+: 5.6 (02/15/2009) Creat: : 3.20 (02/15/2009)   Chol: 173 (08/27/2009)   HDL: 37 (08/27/2009)   LDL: 102 (08/27/2009)   TG: 172 (08/27/2009)  Complete Medication List: 1)  Diovan Hct 320-25 Mg Tabs (Valsartan-hydrochlorothiazide) .... Take 1 tablet by mouth once a day 2)  Simvastatin 80 Mg Tabs (Simvastatin) .... Take one tab by mouth at bedtime 3)  Clonidine Hcl 0.2 Mg Tabs (Clonidine hcl) .... Take 1 tab by mouth at bedtime 4)  Allopurinol 300 Mg Tabs (Allopurinol) .... Take 1 tablet by mouth two times a day 5)  Cvs Iron 325 (65 Fe) Mg Tabs (Ferrous sulfate) .... Take 1 tablet by mouth once a day 6)  Calcitriol 0.25 Mcg Caps (Calcitriol) .... Take 1 tablet by mouth once a day 7)  Torsemide 20 Mg Tabs (Torsemide) .... 2 tabs once daily  Other Orders: T-Basic Metabolic Panel ([id number]) T-CBC w/Diff [phone]) T-PSA [phone]) T-Vitamin D (25-Hydroxy) [phone]) T-Uric  Acid (Blood) ([phone]) T- * Misc. Laboratory test [phone])  Patient Instructions: 1)  Please schedule a follow-up appointment in 4 months. 2)  It is important that you exercise regularly at least 20 minutes 5 times a week. If you develop chest pain, have severe difficulty breathing, or feel very tired , stop exercising immediately and seek medical attention. 3)  You need to lose weight. Consider a lower calorie diet and regular exercise.  4)  BMP prior to visit, ICD-9: 5)  Hepatic Panel prior to visit, ICD-9: 6)  Lipid Panel prior to visit, ICD-9:   copy of labs to Dr Birdie Sons 7)  CBC w/ Diff prior to visit, ICD-9: 8)  vit D and PSA

## 2010-09-02 NOTE — Assessment & Plan Note (Signed)
Summary: evaluate legs /slj   Vital Signs:  Patient profile:   70 year old male O2 Sat:      98 % on Room air Pulse rate:   79 / minute Pulse rhythm:   regular BP sitting:   120 / 70  (left arm)  Vitals Entered By: Baldomero Lamy LPN (December  1, 624THL 2:51 PM)  O2 Flow:  Room air CC: follow-up visit Is Patient Diabetic? No Pain Assessment Patient in pain? no      Comments Mrs. Stencel states med list is accurate   CC:  follow-up visit.  History of Present Illness: Pt has had home health visits by nursing fopr stsis dermatitis, had unna boots, which have recently been removd, here for re-eval and further direction. Legs look the best I have seen them, one small open area on right calf, otherwisegreat, less overgrowth of skin. Faily requests vasc appt in Jan, will comply and will asupervise until [place]   Allergies: No Known Drug Allergies  Review of Systems      See HPI General:  Complains of fatigue. Eyes:  Denies discharge and red eye. ENT:  Denies hoarseness and nasal congestion. CV:  Complains of swelling of feet; denies chest pain or discomfort and palpitations. Resp:  Denies cough and sputum productive. GI:  Denies abdominal pain, constipation, diarrhea, nausea, and vomiting; colostomy working . MS:  Complains of joint pain and stiffness. Derm:  Complains of lesion(s) and poor wound healing; chronic bilateral lower ext venous statsi, improved following aggressive care by home health nursing. Heme:  Denies abnormal bruising, bleeding, enlarge lymph nodes, and fevers. Allergy:  Denies hives or rash and itching eyes.  Physical Exam  General:  Well-developed,morbidly obese,in no acute distress; alert,appropriate and cooperative throughout examination HEENT: No facial asymmetry,  EOMI, No sinus tenderness, TM's Clear, oropharynx  pink and moist.   Chest: Clear to auscultation bilaterally.  CVS: S1, S2, No murmurs, No S3.   Abd: Soft, Nontender. Obese, colostomy  site has no erythema or drainage, colostomy is working. MS: decreased  ROM spine, hips, shoulders and knees.  ma.   CNS: CN 2-12 intact, power tone and sensation normal throughout.   Skin:hyperpigmentation, and lesions on both legs, no open ulcers, rent traumatic ulcer on right leg, not infected, marked reduction in edema Psych: Good eye contact, normal affect.  Memory loss, not anxious or depressed appearing.    Impression & Recommendations:  Problem # 1:  VENOUS STASIS ULCER, CHRONIC (ICD-454.0) Assessment Improved  Orders: Vascular Clinic (Vascular), will folllow untilJanuary when pt establishes with vascular specialist  Problem # 2:  HYPERLIPIDEMIA ([id number]) Assessment: Unchanged  His updated medication list for this problem includes:    Simvastatin 80 Mg Tabs (Simvastatin) .Marland Kitchen... Take one tab by mouth at bedtime  Labs Reviewed: SGOT: 13 (06/04/2010)   SGPT: <8 U/L (06/04/2010)   HDL:39 (06/04/2010), 38 (12/26/2009)  LDL:91 (06/04/2010), 76 (12/26/2009)  Chol:156 (06/04/2010), 134 (12/26/2009)  Trig:129 (06/04/2010), 101 (12/26/2009)  Problem # 3:  HYPERTENSION (ICD-401.9) Assessment: Improved  His updated medication list for this problem includes:    Diovan Hct 320-25 Mg Tabs (Valsartan-hydrochlorothiazide) .Marland Kitchen... Take 1 tablet by mouth once a day    Clonidine Hcl 0.2 Mg Tabs (Clonidine hcl) .Marland Kitchen... Take 1 tab by mouth at bedtime    Torsemide 20 Mg Tabs (Torsemide) .Marland Kitchen... 2 tabs once daily  BP today: 120/70 Prior BP: 140/80 (06/04/2010)  Labs Reviewed: K+: 4.4 (06/04/2010) Creat: : 3.49 (06/04/2010)   Chol: 156 (06/04/2010)  HDL: 39 (06/04/2010)   LDL: 91 (06/04/2010)   TG: 129 (06/04/2010)  Complete Medication List: 1)  Diovan Hct 320-25 Mg Tabs (Valsartan-hydrochlorothiazide) .... Take 1 tablet by mouth once a day 2)  Simvastatin 80 Mg Tabs (Simvastatin) .... Take one tab by mouth at bedtime 3)  Clonidine Hcl 0.2 Mg Tabs (Clonidine hcl) .... Take 1 tab by mouth at  bedtime 4)  Allopurinol 300 Mg Tabs (Allopurinol) .... Take 1 tablet by mouth two times a day 5)  Cvs Iron 325 (65 Fe) Mg Tabs (Ferrous sulfate) .... Take 1 tablet by mouth twice a day 6)  Calcitriol 0.25 Mcg Caps (Calcitriol) .... Take 1 tablet by mouth once a day 7)  Torsemide 20 Mg Tabs (Torsemide) .... 2 tabs once daily  Patient Instructions: 1)  F/U as before. 2)  You will continue to get home health nursing as needed through August 02, 2010. 3)  You need to see a vascular surgeon to supevise your leg care, and will be referred to see Dr Nils Pyle in January in [place]   Orders Added: 1)  Est. Patient Level III [id number] 2)  Vascular Clinic [Vascular]

## 2010-09-02 NOTE — Miscellaneous (Signed)
Summary: Home Care Report  Home Care Report   Imported By: Dierdre Harness 07/08/2010 09:36:29  _____________________________________________________________________  External Attachment:    Type:   Image     Comment:   External Document

## 2010-09-02 NOTE — Letter (Signed)
Summary: xray  xray   Imported By: Eliezer Mccoy 12/31/2009 16:00:53  _____________________________________________________________________  External Attachment:    Type:   Image     Comment:   External Document

## 2010-09-02 NOTE — Assessment & Plan Note (Signed)
Summary: office visit   Vital Signs:  Patient profile:   70 year old male O2 Sat:      94 % on Room air Pulse rate:   81 / minute Pulse rhythm:   regular Resp:     16 per minute BP sitting:   140 / 80  (right arm)  O2 Flow:  Room air CC: follow up   CC:  follow up.  History of Present Illness: Reports  that he has been doing fairly well. Due to severe brakdown of the skin of his legs, his sister asked the kidney specialist's office for help, he is now receiving home health care through them and his legs are wrapped. I suggested a referral to vascular surgery for chronic severe dermatitis manage,ment, she prefers to hold off until this course of treatment is complete, which seems reasonable. Denies recent fever or chills. Denies sinus pressure, nasal congestion , ear pain or sore throat. Denies chest congestion, or cough productive of sputum. Denies chest pain, palpitations, PND, orthopnea or leg swelling. Denies abdominal pain, nausea, vomitting, diarrhea or constipation.colostomy works with no problems. Denies change in bowel movements or bloody stool. Denies dysuria , frequency, incontinence or hesitancy.  Denies headaches, vertigo, seizures. Denies depression, anxiety or insomnia.      Current Medications (verified): 1)  Diovan Hct 320-25 Mg Tabs (Valsartan-Hydrochlorothiazide) .... Take 1 Tablet By Mouth Once A Day 2)  Simvastatin 80 Mg Tabs (Simvastatin) .... Take One Tab By Mouth At Bedtime 3)  Clonidine Hcl 0.2 Mg Tabs (Clonidine Hcl) .... Take 1 Tab By Mouth At Bedtime 4)  Allopurinol 300 Mg Tabs (Allopurinol) .... Take 1 Tablet By Mouth Two Times A Day 5)  Cvs Iron 325 (65 Fe) Mg Tabs (Ferrous Sulfate) .... Take 1 Tablet By Mouth Twice A Day 6)  Calcitriol 0.25 Mcg Caps (Calcitriol) .... Take 1 Tablet By Mouth Once A Day 7)  Torsemide 20 Mg Tabs (Torsemide) .... 2 Tabs Once Daily  Allergies (verified): No Known Drug Allergies  Review of Systems      See  HPI General:  Complains of fatigue. Eyes:  Denies discharge and eye pain. MS:  Complains of joint pain and stiffness; restricted mobility due to massive size and joint stiffness. Derm:  Complains of itching, lesion(s), poor wound healing, and rash. Endo:  Denies cold intolerance, excessive thirst, and excessive urination. Heme:  Denies abnormal bruising and bleeding. Allergy:  Denies hives or rash and itching eyes.  Physical Exam  General:  Well-developed,morbidly obese,in no acute distress; alert,appropriate and cooperative throughout examination HEENT: No facial asymmetry,  EOMI, No sinus tenderness, TM's Clear, oropharynx  pink and moist.   Chest: Clear to auscultation bilaterally.  CVS: S1, S2, No murmurs, No S3.   Abd: Soft, Nontender. Obese, colostomy site has no erythema or drainage, colostomy is working. MS: decreased  ROM spine, hips, shoulders and knees.  ma.   CNS: CN 2-12 intact, power tone and sensation normal throughout.   Skin:legs not examined as wrapped Psych: Good eye contact, normal affect.  Memory loss, not anxious or depressed appearing.    Impression & Recommendations:  Problem # 1:  HYPERLIPIDEMIA (B2193296.4) Assessment Comment Only  His updated medication list for this problem includes:    Simvastatin 80 Mg Tabs (Simvastatin) .Marland Kitchen... Take one tab by mouth at bedtime  Orders: T-Hepatic Function [phone]) T-Lipid Profile [phone])  Labs Reviewed: SGOT: 14 (12/26/2009)   SGPT: <8 U/L (12/26/2009)   HDL:38 (12/26/2009), 37 (08/27/2009)  LDL:76 (  12/26/2009), 102 (08/27/2009)  Chol:134 (12/26/2009), 173 (08/27/2009)  Trig:101 (12/26/2009), 172 (08/27/2009)  Problem # 2:  HYPERTENSION (ICD-401.9) Assessment: Deteriorated  His updated medication list for this problem includes:    Diovan Hct 320-25 Mg Tabs (Valsartan-hydrochlorothiazide) .Marland Kitchen... Take 1 tablet by mouth once a day    Clonidine Hcl 0.2 Mg Tabs (Clonidine hcl) .Marland Kitchen... Take 1 tab by mouth at  bedtime    Torsemide 20 Mg Tabs (Torsemide) .Marland Kitchen... 2 tabs once daily  Orders: T-Basic Metabolic Panel ([id number])  BP today: 140/80 Prior BP: 130/74 (12/26/2009)  Labs Reviewed: K+: 4.4 (12/26/2009) Creat: : 3.49 (12/26/2009)   Chol: 134 (12/26/2009)   HDL: 38 (12/26/2009)   LDL: 76 (12/26/2009)   TG: 101 (12/26/2009)  Problem # 3:  OBESITY, MORBID (ICD-278.01) Assessment: Comment Only  Ht: 64.5 (12/26/2009)   Wt: 323.50 (12/26/2009)   BMI: 54.87 (12/26/2009)  Problem # 4:  ALLERGIC RHINITIS CAUSE UNSPECIFIED (ICD-477.9)  Complete Medication List: 1)  Diovan Hct 320-25 Mg Tabs (Valsartan-hydrochlorothiazide) .... Take 1 tablet by mouth once a day 2)  Simvastatin 80 Mg Tabs (Simvastatin) .... Take one tab by mouth at bedtime 3)  Clonidine Hcl 0.2 Mg Tabs (Clonidine hcl) .... Take 1 tab by mouth at bedtime 4)  Allopurinol 300 Mg Tabs (Allopurinol) .... Take 1 tablet by mouth two times a day 5)  Cvs Iron 325 (65 Fe) Mg Tabs (Ferrous sulfate) .... Take 1 tablet by mouth twice a day 6)  Calcitriol 0.25 Mcg Caps (Calcitriol) .... Take 1 tablet by mouth once a day 7)  Torsemide 20 Mg Tabs (Torsemide) .... 2 tabs once daily  Other Orders: T-TSH [id number]) T- Hemoglobin A1C [id number]) T-CBC w/Diff [id number]) T-Vitamin D (25-Hydroxy) [id number]) T-Anemia Panel 3  (2904)  Patient Instructions: 1)  Please schedule a follow-up appointment in 4 months. 2)  It is important that you exercise regularly at least 20 minutes 5 times a week. If you develop chest pain, have severe difficulty breathing, or feel very tired , stop exercising immediately and seek medical attention. 3)  You need to lose weight. Consider a lower calorie diet and regular exercise.  4)  BMP prior to visit, ICD-9: 5)  Hepatic Panel prior to visit, ICD-9: 6)  Lipid Panel prior to visit, ICD-9: fasting today 7)  TSH prior to visit, ICD-9:CBC and anmia panel an df Vit D 8)  No med changes at this  time   Orders Added: 1)  Est. Patient Level IV [id number] 2)  T-Basic Metabolic Panel 0000000 3)  T-Hepatic Function [80076-22960] 4)  T-Lipid Profile [80061-22930] 5)  T-TSH [id number] 6)  T- Hemoglobin A1C [83036-23375] 7)  T-CBC w/Diff [id number] 8)  T-Vitamin D (25-Hydroxy) [id number] 9)  T-Anemia Panel 3  [id number]

## 2010-09-02 NOTE — Assessment & Plan Note (Signed)
Summary: certification   Allergies: No Known Drug Allergies   Complete Medication List: 1)  Diovan Hct 320-25 Mg Tabs (Valsartan-hydrochlorothiazide) .... Take 1 tablet by mouth once a day 2)  Simvastatin 80 Mg Tabs (Simvastatin) .... Take one tab by mouth at bedtime 3)  Clonidine Hcl 0.2 Mg Tabs (Clonidine hcl) .... Take 1 tab by mouth at bedtime 4)  Allopurinol 300 Mg Tabs (Allopurinol) .... Take 1 tablet by mouth two times a day 5)  Cvs Iron 325 (65 Fe) Mg Tabs (Ferrous sulfate) .... Take 1 tablet by mouth twice a day 6)  Calcitriol 0.25 Mcg Caps (Calcitriol) .... Take 1 tablet by mouth once a day 7)  Torsemide 20 Mg Tabs (Torsemide) .... 2 tabs once daily   Orders Added: 1)  [place] [id number]

## 2010-09-02 NOTE — Assessment & Plan Note (Signed)
Summary: office visit   Vital Signs:  Patient profile:   70 year old male Height:      64.5 inches Weight:      321.75 pounds O2 Sat:      98 % Pulse rate:   84 / minute Pulse rhythm:   regular Resp:     16 per minute BP sitting:   120 / 70  (left arm)  Vitals Entered By: Kate Sable (August 27, 2009 11:30 AM) CC: Follow up chronic problems   CC:  Follow up chronic problems.  History of Present Illness: Reports  that he has been doing well. Denies recent fever or chills. Denies sinus pressure, nasal congestion , ear pain or sore throat. Denies chest congestion, or cough productive of sputum. Denies chest pain, palpitations, PND, orthopnea, he does have  leg swelling which has improved with an increased dose of diuretic. Denies abdominal pain, nausea, vomitting, diarrhea or constipation. Denies change in bowel movements or bloody stool. Denies dysuria , frequency, incontinence or hesitancy. Denies  joint pain, swelling, or reduced mobility. Denies headaches, vertigo, seizures. Denies depression, anxiety or insomnia. Denies  rash, lesions, or itch.     Current Medications (verified): 1)  Allopurinol 300 Mg  Tabs (Allopurinol) .... Take 1 Tablet By Mouth Once A Day 2)  Diovan Hct 320-25 Mg Tabs (Valsartan-Hydrochlorothiazide) .... Take 1 Tablet By Mouth Once A Day 3)  Simvastatin 80 Mg Tabs (Simvastatin) .... Take One Tab By Mouth At Bedtime 4)  Clonidine Hcl 0.2 Mg Tabs (Clonidine Hcl) .... Take 1 Tab By Mouth At Bedtime  Allergies (verified): No Known Drug Allergies  Review of Systems      See HPI Eyes:  Denies blurring and discharge. MS:  Complains of joint pain and stiffness. Neuro:  Complains of poor balance; denies headaches, seizures, and sensation of room spinning. Heme:  Denies abnormal bruising and bleeding. Allergy:  Denies hives or rash and sneezing.  Physical Exam  General:  Well-developed,morbidly obese,in no acute distress; alert,appropriate and  cooperative throughout examination HEENT: No facial asymmetry,  EOMI, No sinus tenderness, TM's Clear, oropharynx  pink and moist.   Chest: Clear to auscultation bilaterally.  CVS: S1, S2, No murmurs, No S3.   Abd: Soft, Nontender. Obese, colostomy site has no erythema or drainage, colostomy is working. MS: decreased  ROM spine, hips, shoulders and knees.  Ext: one plus edema.   CNS: CN 2-12 intact, power tone and sensation normal throughout.   Skin: Intact, hyperpigmented lesions on ant legs, no drainage Psych: Good eye contact, normal affect.  Memory loss, not anxious or depressed appearing.    Impression & Recommendations:  Problem # 1:  CELLULITIS (L8951132.9) Assessment Improved  Problem # 2:  HYPERLIPIDEMIA (P102836.4) Assessment: Comment Only  His updated medication list for this problem includes:    Simvastatin 80 Mg Tabs (Simvastatin) .Marland Kitchen... Take one tab by mouth at bedtime  Orders: T-Lipid Profile [phone])  Labs Reviewed: SGOT: 13 (05/22/2009)   SGPT: <8 U/L (05/22/2009)   HDL:47 (05/22/2009), 43 (10/15/2008)  LDL:113 (05/22/2009), 116 (10/15/2008)  Chol:189 (05/22/2009), 182 (10/15/2008)  Trig:147 (05/22/2009), 115 (10/15/2008)  Problem # 3:  HYPERTENSION (ICD-401.9) Assessment: Improved  The following medications were removed from the medication list:    Cardizem Cd 300 Mg Xr24h-cap (Diltiazem hcl coated beads) .Marland Kitchen... Take 1 tablet by mouth once a day His updated medication list for this problem includes:    Diovan Hct 320-25 Mg Tabs (Valsartan-hydrochlorothiazide) .Marland Kitchen... Take 1 tablet by mouth once  a day    Clonidine Hcl 0.2 Mg Tabs (Clonidine hcl) .Marland Kitchen... 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 Take 1 tablet by mouth once a day 2)  Simvastatin 80 Mg Tabs (Simvastatin) .... Take one tab by mouth at bedtime 3)  Clonidine Hcl 0.2 Mg Tabs (Clonidine hcl) .... Take 1 tab by mouth at bedtime 4)  Allopurinol 300 Mg Tabs (Allopurinol) .... Take 1 tablet by mouth two times a day  Other Orders: T-Uric Acid (Blood) [id number])  Patient Instructions: 1)  Please schedule a follow-up appointment in 4 months. 2)  It is important that you exercise regularly at least 20 minutes 5 times a week. If you develop chest pain, have severe difficulty breathing, or feel very tired , stop exercising immediately and seek medical attention. 3)  You need to lose weight. Consider a lower calorie diet and regular exercise.  4)  You have lost 10 pounds , congrats, keep it up 5)  bP is great. 6)  no med changes Prescriptions: ALLOPURINOL 300 MG TABS (ALLOPURINOL) Take 1 tablet by mouth two times a day  #60 x 5   Entered and Authorized by:   Tula Nakayama MD   Signed by:   Tula Nakayama MD on 08/28/2009   Method used:   Electronically to        [facility] [phone]* (retail)       [address]       [place], [place]  [address]       Ph: [phone] or [phone]       Fax: [phone]   RxID:   [id number] SIMVASTATIN 80 MG TABS (SIMVASTATIN) Take one tab by mouth at bedtime  #30 x 2   Entered by:   Jimmey Ralph LPN   Authorized by:   Tula Nakayama MD   Signed by:   Jimmey Ralph LPN on [id number]   Method used:   Electronically to        [facility] [phone]* (retail)       [place], [place]  [address]       Ph: [phone] or [phone]       Fax: [phone]   RxID:   [id number] HCT 320-25 MG TABS (VALSARTAN-HYDROCHLOROTHIAZIDE) Take 1 tablet by mouth once a day  #30 x  2   Entered by:   Jimmey Ralph LPN   Authorized by:   Tula Nakayama MD   Signed by:   Jimmey Ralph LPN on QA348G   Method used:   Electronically to        [facility] [phone]* (retail)       [address]       [place], [place]  [address]       Ph: [phone] or [phone]       Fax: [phone]   RxID:   [id number]    Medication Administration  Injection # 1:    Medication: Depo- Medrol 80mg     Diagnosis: ALLERGIC RHINITIS CAUSE UNSPECIFIED (ICD-477.9)    Route: IM    Site: RUOQ gluteus    Exp Date: 10/11    Lot #: [id number]    Mfr: [employer]    Patient tolerated injection without complications    Given by: Jimmey Ralph LPN (January 25, 624THL 3:42 PM)  Orders Added: 1)  Est. Patient Level IV [99214] 2)  T-Lipid Profile [80061-22930] 3)  T-Uric Acid (Blood) [id number]  Appended Document: office visit this pt did not have depomedrol , error in charting by nursing  Appended Document: office visit this patient did not recieve a shot during ov. this was entered into his chart by mistake Rick Duff, LPN

## 2010-09-02 NOTE — Miscellaneous (Signed)
Summary: Home Care Report  Home Care Report   Imported By: Dierdre Harness 07/07/2010 11:44:20  _____________________________________________________________________  External Attachment:    Type:   Image     Comment:   External Document

## 2010-09-02 NOTE — Miscellaneous (Signed)
Summary: Home Care Report  Home Care Report   Imported By: Dierdre Harness 06/16/2010 11:28:41  _____________________________________________________________________  External Attachment:    Type:   Image     Comment:   External Document

## 2010-09-02 NOTE — Miscellaneous (Signed)
Summary: Home Care Report  Home Care Report   Imported By: Dierdre Harness 06/24/2010 08:31:06  _____________________________________________________________________  External Attachment:    Type:   Image     Comment:   External Document

## 2010-09-02 NOTE — Letter (Signed)
Summary: progress notes  progress notes   Imported By: Eliezer Mccoy 12/31/2009 15:06:32  _____________________________________________________________________  External Attachment:    Type:   Image     Comment:   External Document

## 2010-09-02 NOTE — Letter (Signed)
Summary: consults  consults   Imported By: Eliezer Mccoy 12/31/2009 15:10:21  _____________________________________________________________________  External Attachment:    Type:   Image     Comment:   External Document

## 2010-09-02 NOTE — Letter (Signed)
[facility]   Imported By: Dierdre Harness 07/04/2010 09:58:36  _____________________________________________________________________  External Attachment:    Type:   Image     Comment:   External Document

## 2010-09-04 NOTE — Miscellaneous (Signed)
Summary: Home Care Report  Home Care Report   Imported By: Dierdre Harness 07/23/2010 13:04:21  _____________________________________________________________________  External Attachment:    Type:   Image     Comment:   External Document

## 2010-09-04 NOTE — Miscellaneous (Signed)
Summary: Home Care Report  Home Care Report   Imported By: Dierdre Harness 07/25/2010 09:02:27  _____________________________________________________________________  External Attachment:    Type:   Image     Comment:   External Document

## 2010-09-04 NOTE — Letter (Signed)
[facility]   Imported By: Dierdre Harness 08/15/2010 14:10:06  _____________________________________________________________________  External Attachment:    Type:   Image     Comment:   External Document

## 2010-09-04 NOTE — Miscellaneous (Signed)
Summary: recert  recert   Imported By: Dierdre Harness 07/16/2010 10:27:06  _____________________________________________________________________  External Attachment:    Type:   Image     Comment:   External Document

## 2010-09-05 ENCOUNTER — Ambulatory Visit (HOSPITAL_COMMUNITY): Payer: Self-pay

## 2010-09-05 ENCOUNTER — Ambulatory Visit (HOSPITAL_COMMUNITY): Admit: 2010-09-05 | Payer: Self-pay | Admitting: Nephrology

## 2010-09-15 ENCOUNTER — Ambulatory Visit (HOSPITAL_COMMUNITY): Admit: 2010-09-15 | Payer: Self-pay | Admitting: Vascular Surgery

## 2010-09-15 ENCOUNTER — Ambulatory Visit (HOSPITAL_COMMUNITY)
Admission: RE | Admit: 2010-09-15 | Discharge: 2010-09-15 | Disposition: A | Discharge status: Home / Self Care | Payer: Medicare Other | Source: Ambulatory Visit | Attending: Vascular Surgery | Admitting: Vascular Surgery

## 2010-09-15 ENCOUNTER — Ambulatory Visit (HOSPITAL_COMMUNITY): Payer: Medicare Other

## 2010-09-15 DIAGNOSIS — I120 Hypertensive chronic kidney disease with stage 5 chronic kidney disease or end stage renal disease: Secondary | ICD-10-CM | POA: Insufficient documentation

## 2010-09-15 DIAGNOSIS — N186 End stage renal disease: Secondary | ICD-10-CM | POA: Insufficient documentation

## 2010-09-15 DIAGNOSIS — Z01812 Encounter for preprocedural laboratory examination: Secondary | ICD-10-CM | POA: Insufficient documentation

## 2010-09-15 DIAGNOSIS — Z01818 Encounter for other preprocedural examination: Secondary | ICD-10-CM | POA: Insufficient documentation

## 2010-09-15 DIAGNOSIS — F1020 Alcohol dependence, uncomplicated: Secondary | ICD-10-CM | POA: Insufficient documentation

## 2010-09-15 DIAGNOSIS — F1027 Alcohol dependence with alcohol-induced persisting dementia: Secondary | ICD-10-CM | POA: Insufficient documentation

## 2010-09-15 DIAGNOSIS — E669 Obesity, unspecified: Secondary | ICD-10-CM | POA: Insufficient documentation

## 2010-09-15 LAB — SURGICAL PCR SCREEN: Staphylococcus aureus: POSITIVE — AB

## 2010-09-15 LAB — POCT I-STAT 4, (NA,K, GLUC, HGB,HCT)
Potassium: 5.1 mEq/L (ref 3.5–5.1)
Sodium: 140 mEq/L (ref 135–145)

## 2010-09-19 ENCOUNTER — Encounter (HOSPITAL_COMMUNITY): Payer: Medicare Other | Attending: Hematology and Oncology

## 2010-09-19 ENCOUNTER — Ambulatory Visit (HOSPITAL_COMMUNITY): Payer: Medicare Other

## 2010-09-19 DIAGNOSIS — I129 Hypertensive chronic kidney disease with stage 1 through stage 4 chronic kidney disease, or unspecified chronic kidney disease: Secondary | ICD-10-CM | POA: Insufficient documentation

## 2010-09-19 DIAGNOSIS — N189 Chronic kidney disease, unspecified: Secondary | ICD-10-CM | POA: Insufficient documentation

## 2010-09-19 DIAGNOSIS — D631 Anemia in chronic kidney disease: Secondary | ICD-10-CM | POA: Insufficient documentation

## 2010-09-24 NOTE — Op Note (Signed)
NAMENEELESH, NISHIKAWA               ACCOUNT NO.:  [id number]  MEDICAL RECORD NO.:  [id number]           PATIENT TYPE:  O  LOCATION:  [facility]                         FACILITY:  [facility]  PHYSICIAN:  Charles E. Fields, MD  DATE OF BIRTH:  08-05-1940  DATE OF PROCEDURE:  09/15/2010 DATE OF DISCHARGE:  09/15/2010                              OPERATIVE REPORT   PROCEDURE:  Left forearm AV graft.  PREOPERATIVE DIAGNOSIS:  End-stage renal disease.  POSTOPERATIVE DIAGNOSIS:  End-stage renal disease.  ANESTHESIA:  Local with IV sedation.  ASSISTANT:  Leta Baptist, PA  OPERATIVE FINDINGS: 1. Inadequate cephalic vein for construction of a fistula. 2. A 47-mm tapered PTFE graft, brachial artery to basilic vein.  OPERATIVE DETAILS:  After obtaining informed consent, the patient was taken to the operating room.  The patient was placed in supine position on the operating table.  After adequate sedation, the patient's entire left upper extremity was prepped and draped in usual sterile fashion. Local anesthesia was infiltrated near the antecubital crease.  A transverse incision was made in this location, carried down through the subcutaneous tissues down to the level of the cephalic vein.  Cephalic vein preoperatively on vein mapping had been suggested to be greater than 3 mm in diameter; however, on exploration, it was fairly small and had multiple side branches.  These were all dissected free circumferentially.  The vein was then transected distally and an attempt was made to distend this vein up with heparinized saline.  However, I could not get the vein to distend much, except a 2.5-mm dilator.  At this point, the cephalic vein was ligated with a 2-0 silk tie. Attention was then turned to the brachial artery.  The brachial artery was dissected free circumferentially in the medial portion incision. Adjacent basilic vein was of reasonable quality, approximately 3 mm in diameter.  This was  dissected free circumferentially and small side branch was ligated and divided between silk ties.  Next, a subcutaneous tunnel was created in a loop configuration down to the forearm with a 47- mm tapered PTFE graft brought through the subcutaneous tunnel, 4 mm into the graft, was on the radial aspect of the forearm.  The patient was given 5000 units of intravenous heparin.  Vessel loops were used to control brachial artery proximally and distally.  A longitudinal opening was made in the brachial artery.  The 4-mm end of the graft was beveled and sewn end to graft with side of artery using a running 6-0 Prolene suture.  Just prior to completion anastomosis, this was fore bled, back bled and thoroughly flushed.  Anastomosis was secured.  Clamps were released.  There was pulsatile flow in the graft immediately.  Next, a small bulldog clamps were used to control the basilic vein proximally and distally.  Longitudinal opening was made in the vein.  The graft was beveled and sewn end to graft to side of vein using a running 6-0 Prolene suture.  Just prior to completion anastomosis, this was fore bled, back bled, and thoroughly flushed.  Anastomosis was secured, clamps were released.  There was palpable thrill above  the graft immediately.  Hemostasis was obtained with thrombin and Gelfoam, 50 mg of protamine for reversal of heparin.  After hemostasis had been attained, subcutaneous tissues of both incisions were reapproximated using running 3-0 Vicryl suture and 4-0 Vicryl subcuticular stitch in the skin. Dermabond was applied to the skin as well.  The patient tolerated the procedure well, and there were no complications.  Instruments, sponge, and needle counts were correct at the end of case.  The patient was taken to the recovery room in stable condition.     Jessy Oto. Fields, MD     CEF/MEDQ  D:  09/15/2010  T:  09/16/2010  Job:  [id number]  Electronically Signed by Ruta Hinds MD  on 09/24/2010 03:09:06 PM

## 2010-10-03 ENCOUNTER — Ambulatory Visit (INDEPENDENT_AMBULATORY_CARE_PROVIDER_SITE_OTHER): Payer: Medicare Other | Admitting: Family Medicine

## 2010-10-03 ENCOUNTER — Ambulatory Visit: Payer: Medicare Other

## 2010-10-03 ENCOUNTER — Other Ambulatory Visit (HOSPITAL_COMMUNITY): Payer: Self-pay | Admitting: Oncology

## 2010-10-03 ENCOUNTER — Ambulatory Visit (HOSPITAL_COMMUNITY): Payer: Medicare Other

## 2010-10-03 ENCOUNTER — Encounter: Payer: Self-pay | Admitting: Family Medicine

## 2010-10-03 DIAGNOSIS — R7989 Other specified abnormal findings of blood chemistry: Secondary | ICD-10-CM

## 2010-10-03 DIAGNOSIS — D631 Anemia in chronic kidney disease: Secondary | ICD-10-CM | POA: Insufficient documentation

## 2010-10-03 DIAGNOSIS — N186 End stage renal disease: Secondary | ICD-10-CM | POA: Insufficient documentation

## 2010-10-03 DIAGNOSIS — I129 Hypertensive chronic kidney disease with stage 1 through stage 4 chronic kidney disease, or unspecified chronic kidney disease: Secondary | ICD-10-CM | POA: Insufficient documentation

## 2010-10-03 DIAGNOSIS — N039 Chronic nephritic syndrome with unspecified morphologic changes: Secondary | ICD-10-CM | POA: Insufficient documentation

## 2010-10-03 DIAGNOSIS — N189 Chronic kidney disease, unspecified: Secondary | ICD-10-CM | POA: Insufficient documentation

## 2010-10-03 DIAGNOSIS — E785 Hyperlipidemia, unspecified: Secondary | ICD-10-CM

## 2010-10-03 LAB — RENAL FUNCTION PANEL
CO2: 22 mEq/L (ref 19–32)
GFR calc Af Amer: 24 mL/min — ABNORMAL LOW (ref >60)
GFR calc non Af Amer: 19 mL/min — ABNORMAL LOW (ref >60)
Glucose, Bld: 99 mg/dL (ref 70–99)
Phosphorus: 3.5 mg/dL (ref 2.3–4.6)
Potassium: 4.3 mEq/L (ref 3.5–5.1)
Sodium: 140 mEq/L (ref 135–145)

## 2010-10-04 LAB — CONVERTED CEMR LAB
Hgb A1c MFr Bld: 6 % — ABNORMAL HIGH (ref <5.7)
Uric Acid, Serum: 7 mg/dL (ref 4.0–7.8)

## 2010-10-09 ENCOUNTER — Ambulatory Visit (INDEPENDENT_AMBULATORY_CARE_PROVIDER_SITE_OTHER): Payer: Medicare Other

## 2010-10-09 DIAGNOSIS — N186 End stage renal disease: Secondary | ICD-10-CM

## 2010-10-09 NOTE — Assessment & Plan Note (Signed)
Summary: follow up   Vital Signs:  Patient profile:   70 year old male Height:      64.5 inches O2 Sat:      96 % Pulse rate:   70 / minute Pulse rhythm:   regular Resp:     16 per minute BP sitting:   150 / 80  (left arm)  Vitals Entered By: Baldomero Lamy LPN (March  2, [id number] AM) CC: follow up Is Patient Diabetic? No   CC:  follow up.  History of Present Illness: Reports  that he is doing fairly well. He has been busy since his last visit. He established with local wound center, and just last week was df/c duntil/unless he needs to be seen again, the family was pleased. He had a shunt placed in hios left arm recently, and will soon start dialysis. Denies recent fever or chills. Denies sinus pressure, nasal congestion , ear pain or sore throat. Denies chest congestion, or cough productive of sputum. Denies chest pain, palpitations, PND, orthopnea or leg swelling. Denies abdominal pain, nausea, vomitting, diarrhea or constipation.Colostomy is working well Denies change in bowel movements or bloody stool. Denies dysuria , frequency, incontinence or hesitancy. His diet has not changed, nor has his weight . He remains essentially non ambulatory, and is transported by wheelchair by his family Denies headaches, vertigo, seizures. Denies depression, anxiety or insomnia. Denies  rash, lesions, or itch.     Current Medications (verified): 1)  Diovan Hct 320-25 Mg Tabs (Valsartan-Hydrochlorothiazide) .... Take 1 Tablet By Mouth Once A Day 2)  Simvastatin 80 Mg Tabs (Simvastatin) .... Take One Tab By Mouth At Bedtime 3)  Clonidine Hcl 0.2 Mg Tabs (Clonidine Hcl) .... Take 1 Tab By Mouth At Bedtime 4)  Allopurinol 300 Mg Tabs (Allopurinol) .... Take 1 Tablet By Mouth Two Times A Day 5)  Cvs Iron 325 (65 Fe) Mg Tabs (Ferrous Sulfate) .... Take 1 Tablet By Mouth Twice A Day 6)  Calcitriol 0.25 Mcg Caps (Calcitriol) .... Take 1 Tablet By Mouth Once A Day 7)  Torsemide 20 Mg Tabs  (Torsemide) .... 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 Eyes:  Denies discharge and red eye. GU:  ESRD, to start dioalysis soon. MS:  Complains of joint pain, low back pain, mid back pain, and stiffness; non weight bearing , prmarily due to size. Endo:  Denies cold intolerance, excessive hunger, excessive thirst, and excessive urination. Heme:  Denies abnormal bruising and bleeding. Allergy:  Complains of seasonal allergies; denies hives or rash and itching eyes; otc med used as needed.  Physical Exam  General:  Well-developed,morbidly obese,in no acute distress; alert,appropriate and cooperative throughout examination. Pt in wheelchair throughout entire visit HEENT: No facial asymmetry,  EOMI, No sinus tenderness,r, oropharynx  pink and moist.Poor dentition  Chest: Clear to auscultation bilaterally.  CVS: S1, S2, No murmurs, No S3.   Abd: Soft, Nontender. Obese, colostomy site has no erythema or drainage, colostomy is working. MS: decreased  ROM spine, hips, shoulders and knees.  CNS: CN 2-12 intact, power tone and sensation normal throughout.   Skin:intact Psych: Good eye contact, normal affect.  Memory loss, not anxious or depressed appearing.    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 Take one tab by mouth at bedtime Low fat dietdiscussed and encouraged  Labs Reviewed: SGOT: 13 (06/04/2010)   SGPT: <8 U/L (06/04/2010)   HDL:39 (06/04/2010), 38 (12/26/2009)  LDL:91 (06/04/2010), 76 (12/26/2009)  Chol:156 (06/04/2010), 134 (12/26/2009)  Trig:129 (06/04/2010), 101 (12/26/2009)  Problem # 5:  HYPERTENSION (ICD-401.9) Assessment: Deteriorated  His updated medication list for this problem includes:    Diovan Hct 320-25 Mg Tabs (Valsartan-hydrochlorothiazide) .Marland Kitchen... Take 1 tablet by mouth once a day    Clonidine Hcl 0.2 Mg Tabs (Clonidine hcl) .Marland Kitchen... Take 1 tab by mouth at bedtime    Torsemide 20 Mg Tabs (Torsemide) .Marland Kitchen... 2 tabs once daily  BP today: 150/80 Prior BP: 120/70 (07/03/2010)  Labs Reviewed: K+: 4.4 (06/04/2010) Creat: : 3.49 (06/04/2010)   Chol: 156 (06/04/2010)   HDL: 39 (06/04/2010)   LDL: 91 (06/04/2010)   TG: 129 (06/04/2010)  Problem # 6:  END STAGE RENAL DISEASE (ICD-585.6) Assessment: Comment Only pt to start dialysis in the near future per family, will get notes from the nephrologist  Complete Medication List: 1)  Diovan Hct 320-25 Mg Tabs (Valsartan-hydrochlorothiazide) .... Take 1 tablet by mouth once a day 2)  Simvastatin 80 Mg Tabs (Simvastatin) .... Take one tab by mouth at bedtime 3)  Clonidine Hcl 0.2 Mg Tabs (Clonidine hcl) .... Take 1 tab by mouth at bedtime 4)  Allopurinol 300 Mg Tabs (Allopurinol) .... Take 1 tablet by mouth two times a day 5)  Cvs Iron 325 (65 Fe) Mg Tabs (Ferrous sulfate) .... Take 1 tablet by mouth twice a day 6)  Calcitriol 0.25 Mcg Caps (Calcitriol) .... Take 1 tablet by mouth once a day 7)  Torsemide 20 Mg Tabs (Torsemide) .... 2 tabs  once daily  Other Orders: T- Hemoglobin A1C [id number])  Patient Instructions: 1)  Please schedule a follow-up appointment in 4.5 months. 2)  HbgA1C prior to visit, ICD-9:  today. 3)  It is important that you exercise regularly at least 20 minutes 5 times a week. If you develop chest pain, have severe difficulty breathing, or feel very tired , stop exercising immediately and seek medical attention. 4)  You need to lose weight. Consider a lower calorie diet and regular exercise.  5)  no med changes   Orders Added: 1)  Est. Patient Level IV [99214] 2)  T- Hemoglobin A1C [[id number]] 3)  T-Uric Acid (Blood) [id number]

## 2010-10-09 NOTE — Assessment & Plan Note (Signed)
OFFICE VISIT  [name] [name] MORELAN DOB:  March 21, 1941                                       10/09/2010 CHART#:[id number]  This is a postop check.  CHIEF COMPLAINT:  Follow-up, left forearm AV fistula.  Patient is a 70 year old gentleman who had a left forearm AV graft placed on September 15, 2010.  He comes in for a postop check today.  He has no signs of steal.  He has a moderate amount of swelling and bruising along the tunnel areas of the AV fistula, otherwise his wounds are healing well.  He has a palpable radial pulse.  His heart rate was 67.  His sats were 95%, and his respiratory rate was 12.  ASSESSMENT:  He has a functioning left forearm arteriovenous graft with good thrill and bruit with no signs of steal syndrome.  Plan is to have the patient follow up as needed.  He is not yet on hemodialysis, and the graft will be ready to use on March 13.  He will follow up with [facility] on 10/15/2010.  Wray Kearns, PA-C  [place]. Scot Dock, M.D. Electronically Signed  RR/MEDQ  D:  10/09/2010  T:  10/09/2010  Job:  [id number]

## 2010-10-13 LAB — RENAL FUNCTION PANEL
BUN: 64 mg/dL — ABNORMAL HIGH (ref 6–23)
CO2: 20 mEq/L (ref 19–32)
Calcium: 9.4 mg/dL (ref 8.4–10.5)
Creatinine, Ser: 3.56 mg/dL — ABNORMAL HIGH (ref 0.4–1.5)
GFR calc Af Amer: 21 mL/min — ABNORMAL LOW (ref >60)
GFR calc non Af Amer: 17 mL/min — ABNORMAL LOW (ref >60)

## 2010-10-14 LAB — RENAL FUNCTION PANEL
Albumin: 3.9 g/dL (ref 3.5–5.2)
BUN: 61 mg/dL — ABNORMAL HIGH (ref 6–23)
Calcium: 9.3 mg/dL (ref 8.4–10.5)
Creatinine, Ser: 4.04 mg/dL — ABNORMAL HIGH (ref 0.4–1.5)
GFR calc Af Amer: 18 mL/min — ABNORMAL LOW (ref >60)
GFR calc non Af Amer: 15 mL/min — ABNORMAL LOW (ref >60)
Phosphorus: 4.4 mg/dL (ref 2.3–4.6)

## 2010-10-14 LAB — HEMOGLOBIN AND HEMATOCRIT, BLOOD
HCT: 33.2 % — ABNORMAL LOW (ref 39.0–52.0)
Hemoglobin: 10.8 g/dL — ABNORMAL LOW (ref 13.0–17.0)

## 2010-10-15 LAB — RENAL FUNCTION PANEL
BUN: 51 mg/dL — ABNORMAL HIGH (ref 6–23)
CO2: 19 mEq/L (ref 19–32)
Calcium: 8.7 mg/dL (ref 8.4–10.5)
Chloride: 110 mEq/L (ref 96–112)
Creatinine, Ser: 3.35 mg/dL — ABNORMAL HIGH (ref 0.4–1.5)
GFR calc non Af Amer: 18 mL/min — ABNORMAL LOW (ref >60)
Glucose, Bld: 99 mg/dL (ref 70–99)

## 2010-10-16 LAB — HEMOGLOBIN AND HEMATOCRIT, BLOOD: HCT: 27.7 % — ABNORMAL LOW (ref 39.0–52.0)

## 2010-10-16 LAB — RENAL FUNCTION PANEL
Albumin: 3.7 g/dL (ref 3.5–5.2)
BUN: 61 mg/dL — ABNORMAL HIGH (ref 6–23)
CO2: 20 mEq/L (ref 19–32)
Chloride: 112 mEq/L (ref 96–112)
Glucose, Bld: 99 mg/dL (ref 70–99)
Potassium: 4.3 mEq/L (ref 3.5–5.1)

## 2010-10-17 ENCOUNTER — Ambulatory Visit (HOSPITAL_COMMUNITY): Payer: Medicare Other

## 2010-10-17 ENCOUNTER — Other Ambulatory Visit (HOSPITAL_COMMUNITY): Payer: Self-pay | Admitting: Oncology

## 2010-10-17 ENCOUNTER — Encounter (HOSPITAL_COMMUNITY): Payer: Medicare Other | Attending: Nephrology

## 2010-10-17 LAB — RENAL FUNCTION PANEL
Albumin: 3.7 g/dL (ref 3.5–5.2)
BUN: 51 mg/dL — ABNORMAL HIGH (ref 6–23)
Calcium: 9.1 mg/dL (ref 8.4–10.5)
Glucose, Bld: 111 mg/dL — ABNORMAL HIGH (ref 70–99)
Phosphorus: 4.2 mg/dL (ref 2.3–4.6)
Sodium: 135 mEq/L (ref 135–145)

## 2010-10-17 LAB — HEMOGLOBIN AND HEMATOCRIT, BLOOD
HCT: 32.8 % — ABNORMAL LOW (ref 39.0–52.0)
Hemoglobin: 10.7 g/dL — ABNORMAL LOW (ref 13.0–17.0)

## 2010-11-04 ENCOUNTER — Other Ambulatory Visit: Payer: Self-pay | Admitting: Family Medicine

## 2010-11-21 ENCOUNTER — Encounter (HOSPITAL_COMMUNITY): Payer: Medicare Other | Attending: Nephrology

## 2010-11-21 ENCOUNTER — Encounter (HOSPITAL_COMMUNITY): Payer: Medicare Other

## 2010-11-21 DIAGNOSIS — N189 Chronic kidney disease, unspecified: Secondary | ICD-10-CM | POA: Insufficient documentation

## 2010-11-21 DIAGNOSIS — N039 Chronic nephritic syndrome with unspecified morphologic changes: Secondary | ICD-10-CM | POA: Insufficient documentation

## 2010-11-21 DIAGNOSIS — D631 Anemia in chronic kidney disease: Secondary | ICD-10-CM | POA: Insufficient documentation

## 2010-11-21 DIAGNOSIS — I129 Hypertensive chronic kidney disease with stage 1 through stage 4 chronic kidney disease, or unspecified chronic kidney disease: Secondary | ICD-10-CM | POA: Insufficient documentation

## 2010-12-16 NOTE — H&P (Signed)
NAME:  [name] Snow, [name] Snow               ACCOUNT NO.:  [id number]   MEDICAL RECORD NO.:  [id number]          PATIENT TYPE:  INP   LOCATION:  A302                          FACILITY:  [facility]   PHYSICIAN:  [name] Lofty, MD   DATE OF BIRTH:  [birth year]   DATE OF ADMISSION:  07/23/2007  DATE OF DISCHARGE:  LH                              HISTORY & PHYSICAL   PRIMARY MEDICAL DOCTOR:  Dr. Moshe Snow.   CHIEF COMPLAINT:  Increased lower leg swelling and pain with some  discharge.   HISTORY OF PRESENT ILLNESS:  [name] Snow is a 70 year old man with  history of congestive heart failure, hypertension, dementia,  hypercholesterolemia, and history of pancreatitis, status post colostomy  for several years, who presented with the above complaint to the  emergency room.  As per the  patient and the patient's sister, the  patient lives in [place] and lives alone, and he used to do his daily  activity, but the last 2 days he was unable to walk because of the  massive swelling and very intolerable  pain, and she noticed also some  oozing from his legs, and she decided to bring him to the emergency  room. The patient is very obese and he had colostomy bag for several  years.  As per the patient's sister, it was done by Dr. Tamala Snow years  back, and it was for bowel obstruction, and since then he was used to  using this colostomy bag without any problem.  The patient has also mild  dementia, and he was not able to tell me what medication and what  medical problems he had.  He denied any fever, and he denied any  abdominal complaints or urinary complaint.  His legs were swollen for  several years, but the last few days it was worsened and it became way  painful and started to ooze also some watery  purulent fluid.  He had  some shortness of breath, but as per the patient's sister, he always  used to have shortness of breath related to his heart failure, as well  as his body habitus.  He denied any smoking.  He  denied any alcohol or  any drug use, and he did not have any home health aide who comes to him.   REVIEW OF SYSTEMS:  A 10-point review of system is as dictated in the  HPI, otherwise noncontributory.   ALLERGY:  NO KNOWN DRUG ALLERGIES.   SOCIAL HISTORY:  He lives alone in apartment, and his sister used to  visit him every other day and help him with some shopping and other  daily activities, but most of it he will do by himself.  He denied  smoking and denied any alcohol or drug abuse.   FAMILY HISTORY:  Noncontributory.   PAST MEDICAL HISTORY:  1. Acid reflux disease.  2. Mild dementia.  3. Hypercholesterolemia.  4. Hypertension.  5. History of pancreatitis.  6. Congestive heart failure.  7. Status post colostomy bag.   HOME MEDICATIONS:  1. Metoclopramide 5 mg four times a day.  2. Lasix 80  mg twice a day p.o.  3. Simvastatin 40 mg p.o. at bedtime.  4. Allopurinol 300 mg once a day.  5. Tarka 2 mg/240 mg once a day.  6. Potassium chloride 20 mEq three times a day.   PHYSICAL EXAMINATION:  GENERAL:  The patient is lying on the bed with  mild respiratory distress and he is obese.  VITAL SIGNS:  Blood pressure is 122 x 59, temperature 98 and pulse is  64, respiratory rate 26, and saturation is 98% on room air.  HEENT:  Has  pink conjunctivae.  Nonicteric sclera.  NECK:  Supple.  CHEST:  He has good air entry bilaterally and no wheezes.  No rales or  crepitus. .  CVS:  S1-S2 well-heard regular, no murmur.  ABDOMEN:  Obese.  No area of tenderness, and colostomy bag is  functioning and clean.  No sign of infection.  EXTREMITIES:  He has very obese extremities with swollen lower leg  bilaterally and darkened background and oozing some purulent fluid,  mixes of discharge and slightly tender on both sides and slightly warm  on touching.  CNS:  He is alert but mildly demented.  She could not tell his  medications and medical problems properly, but there is not any   neurological deficit.   LABS:  White blood cells 7.7, hemoglobin is 10.5, hematocrit is 31.7,  platelet count 347.  On the chemistry, sodium is 138, potassium is 5.9,  CO2 is 23, glucose is 114, BUN 21, creatinine is 1.75.  Other labs are  not sent.   ASSESSMENT:  1. Bilateral leg swelling with sign of cellulitis and infected wound      on the background of chronic lymphedema.  Will admit him and put      him on IV antibiotics and will elevate both legs and will send      culture from the wound and blood culture and put him on IV Lasix,      as well.  2. History of congestive heart failure.  The patient is on high dose      of Lasix, and he has worsened leg swelling, even though does not      look like he is on pulmonary edema.  He has this extensive leg      swelling, and we will give him high-dose Lasix IV to help his leg      wound healing, as well.  Will do BNP and the chest x-ray, to see if      there is any sign of pulmonary edema, and we will do Doppler      ultrasound of the lower extremity as well.  3. Hypertension, hypercholesterolemia, history of pancreatitis and      dementia stable, and will continue with his home medications.  4. Mild renal insufficiency.  I am not sure whether this is new or      chronic but will monitor BUN and creatinine, and we will put him on      heparin DVT prophylaxis, rather than Lovenox as well.  5. Mild hyperkalemia and will give him Kayexalate and will repeat      potassium level and will put him on Protonix.      [name] Lofty, MD  Electronically Signed     MT/MEDQ  D:  07/23/2007  T:  07/24/2007  Job:  [id number]

## 2010-12-16 NOTE — Consult Note (Signed)
[name] Snow, [name] Snow               ACCOUNT NO.:  [id number]   MEDICAL RECORD NO.:  [id number]          PATIENT TYPE:  INP   LOCATION:  [id number]                          FACILITY:  [facility]   PHYSICIAN:  Jamesetta So, M.D.  DATE OF BIRTH:  [birth year]   DATE OF CONSULTATION:  08/02/2007  DATE OF DISCHARGE:                                 CONSULTATION   REASON FOR CONSULTATION:  Nausea and vomiting.   HISTORY OF PRESENT ILLNESS:  The patient is a 70 year old black male  with multiple medical problems who presented to the emergency room with  worsening nausea and vomiting.  The the patient has a history of  alcoholic encephalopathy with dementia.  In addition, he has  longstanding history of pancreatic insufficiency, hypertension,  neurogenic bladder, renal insufficiency, and gout.  He has had multiple  surgeries in the past by Dr. Irving Shows.  These included a total  colectomy with ileoproctostomy in 1998 and a permanent ileostomy in  2002.  He had admission back in 2006 for a similar problem with nausea  and vomiting which resolved without any further surgery.  History is  limited, given the patient's mental status.   MEDICATIONS AND ALLERGIES:  Noted on the chart.   PHYSICAL EXAMINATION:  GENERAL:  On physical examination, the patient is  a pleasant 70 year old black male in no acute distress.  ABDOMEN:  Soft with positive bowel sounds.  An ileostomy is present in  the right lower quadrant.  There are intestinal contents as well as air  within the ileostomy bag.   LABORATORY DATA:  Met-7 is remarkable for a BUN of 50 and creatinine  2.58.  Potassium is within normal limits.  White blood cell count is  11.9, hematocrit 39, and platelet count 396,000.   CT scan of the abdomen and pelvis reportedly was unremarkable for any  acute surgical obstruction.  The PAC system is down and I cannot review  the CT at this time.   IMPRESSION:  Nausea and vomiting of unknown etiology.  I doubt at  this  point that it is due to a surgical obstructive process.   PLAN:  I will advance the patient to a full liquid diet and follow him  with you.  Further management pending this outcome.      Jamesetta So, M.D.  Electronically Signed     MAJ/MEDQ  D:  08/02/2007  T:  08/02/2007  Job:  [id number]   cc:   Norwood Levo. Moshe Cipro, M.D.  Fax: [phone]

## 2010-12-16 NOTE — Discharge Summary (Signed)
NAME:  [name] Snow, [name] Snow               ACCOUNT NO.:  [id number]   MEDICAL RECORD NO.:  [id number]          PATIENT TYPE:  INP   LOCATION:  A302                          FACILITY:  [facility]   PHYSICIAN:  Merry Lofty, MD   DATE OF BIRTH:  06/06/1941   DATE OF ADMISSION:  07/23/2007  DATE OF DISCHARGE:  12/23/2008LH                               DISCHARGE SUMMARY   PRIMARY MEDICAL DOCTOR:  Dr. Tula Nakayama   DISCHARGE DIAGNOSES:  1. Bilateral lower leg edema, improving.  2. Bilateral leg cellulitis with some sign of infection, improved.  3. 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  4. History of congestive heart failure, compensated.  5. Mild dementia, stable.  6. Hypercholesterolemia, stable.  7. Hypertension, fairly controlled.  8. History of chronic pancreatitis, pancreatic insufficiency.  9. Status post colostomy back for several years.  10.Morbid obesity.  11.Acid reflux disease.  12.Chronic renal insufficiency, needs followup with nephrologist after      he gets referral from his primary medical doctor.   HOME MEDICATIONS:  1. Lasix 80 mg p.o. twice a day.  2. Metoclopramide 5 mg four times a day.  3. Simvastatin 40 mg p.o. at bedtime.  4. Allopurinol 300 mg once a day.  5. Tarka 2 mg/240 mg once a day.  6. Potassium chloride 20 mEq p.o. three times a day.  7. Augmentin 875 mg p.o. b.i.d. for 7 days.   HOSPITAL COURSE:  He is 70 year old man with the above medical problems  who lives with his sister and presented with increasing leg swelling,  pain and some oozing on both lower legs.  He had history of chronic,  very severe lymphedema for several years with darkening of his lower leg  and lichenification of his lower leg as well.  He was admitted for  increased bilateral leg edema with oozing and some signs of cellulitis  with infection.  He was put on a high dose of Lasix 80 mg IV as he was  taking p.o. the same dose at home.   He was put on Unasyn IV and he  responded well.  Currently, both legs decreased the swelling, there is  no sign of pitting, no oozing and no sign of any cellulitis and no  discharge at all.  Both legs are dry.  He will continue p.o. antibiotic  Augmentin at home and he will continue his Lasix as before.  He was  advised to have follow up with his PMD.  He has already scheduled to  have appointment followup in her office on August 08, 2007, and he will  be followed his BUN/creatinine by PMD and probably he might need  followup also by nephrologist after referral from her.  Otherwise,  currently the patient is afebrile, no leukocytosis, and his leg swelling  is to his baseline.  There was not any sign of congestive heart failure  now.  BMP was normal.  Chest x-ray was without any sign of pulmonary  edema.  He has not any shortness of breath while he was in the  hospital.   VITAL SIGNS CURRENTLY:  Temperature 98, pulse 86, respiratory rate 20,  blood pressure 154/82, saturation is 97% on room air.  HEENT:  He has pink conjunctivae, nonicteric sclera.  NECK:  Supple.  CHEST:  He has good air entry bilaterally.  No wheezes, no rhonchi or  rales.  CVS:  S1, S2 regular, no murmur.  ABDOMEN:  Obese, no area of tenderness, and difficult to appreciate if  there is any organomegaly or mass.  Normoactive bowel sounds.  EXTREMITIES:  He has chronic bilateral lymphedema with darkening and  lichenification of both legs.  There is not any sign of cellulitis.  There is no oozing currently.  Both legs are dry without any wound.  CNS:  He is alert but mildly demented.   LABORATORIES:  Today, white blood cells 8, hemoglobin is 12 and  hematocrit is 35.9, and platelet count is 397.  On the chemistry, sodium  is 139, potassium is 3.8, chloride is 101, bicarb 29, and glucose is  115, BUN is 27, creatinine 2.  Urinalysis is negative.  BNP during  admission is less than 30.  X-ray of the chest also showed  cardiomegaly  and mild vascular congestion.  Venous ultrasound of the lower legs was  done.  It was limited examination, but femoral vein and proximal  superficial vein was patent bilaterally and there is no visible DVT even  though limited examination.   PLAN OF DISCHARGE:  The patient will be discharged today with the above  medication and followup.  His sister is at the bedside and educated  about his medications and about the followup extensively.      Merry Lofty, MD  Electronically Signed     MT/MEDQ  D:  07/26/2007  T:  07/26/2007  Job:  [id number]   cc:   Norwood Levo. Moshe Cipro, M.D.  Fax: [phone]

## 2010-12-16 NOTE — Consult Note (Signed)
NAME:  [name] Snow, [name] Snow               ACCOUNT NO.:  [id number]   MEDICAL RECORD NO.:  [id number]          [name] Snow TYPE:  INP   LOCATION:  [id number]                          FACILITY:  [facility]   PHYSICIAN:  Alison Murray, M.D.DATE OF BIRTH:  [birth year]   DATE OF CONSULTATION:  DATE OF DISCHARGE:                                 CONSULTATION   REASON FOR CONSULTATION:  Elevated BUN and creatinine.   HISTORY OF PRESENT ILLNESS:  [name] Snow is a 70 year old African-  American with multiple past medical history including history of  hypertension, history of pancreatitis, history of dementia and also  history of chronic renal failure, presently was brought because of  abdominal pain and some nausea.  [name] Snow has a previous history of  some small bowel and large bowel dilatation and because of that he had  initially a total colectomy with ileostomy placement more than 10 years  ago and finally because of recurrent problem, this was replaced with an  end-to-end ileostomy and also rectal pouch.  Since then [name] Snow has been  admitted multiple times with nausea, vomiting, abdominal pain and  presently he came with similar problem and the [name] Snow is admitted to  the hospital since his BUN and creatinine seems to be elevated.  Hence a  consult is called.  Looking at his note, the [name] Snow seems to have a  problem with his kidneys for __________  2 years.  Etiology at this  moment is not clear.   PAST MEDICAL HISTORY:  1. History of CHF.  2. History of hypertension.  3. History of dementia.  4. History of pancreatitis.  5. History of GERD.  6. History of chronic lymphedema.  7. History of gout.  8. History of chronic renal failure.  9. History of BPH.  10.__________ documentation of neurogenic bladder.  11.Previous history of alcohol abuse, probably his dementia __________      related to that.   SURGICAL HISTORY:  Status post total colectomy with ileostomy, 1998  __________  and rectal pouch  placement.  At this point it is very  difficult to get any additional history whether he has other workups.   CURRENT MEDICATIONS:  His medications at this moment mainly consist of:  1. Allopurinol __________  mg orally daily.  2. Rocephin 1 gram intravenously every 24 hours.  3. Half-normal saline at 85 cc/hour __________  grams subcutaneously      24-hour.  4. Lasix 40 mg intravenously every 8 hours.  5. Protonix 40 mg intravenously every 24 hours.  6. Zocor 40 mg orally daily.  7. Verapamil __________  mg daily.  8. __________  mg orally daily.  Other medications are as-needed medications.   ALLERGIES TO MEDICATIONS:  NO KNOWN ALLERGIES..   SOCIAL HISTORY:  At this moment there is no history of alcohol abuse but  [name] Snow has had a problem from before and no history of this moment  smoking.  Presently [name] Snow __________  has a sister __________  power  of attorney.   REVIEW OF SYSTEMS:  Very difficult to get any history.  __________  [name] Snow is  alert.  He denies any nausea/vomiting at this moment.  He  denies any shortness of breath.  He denies any diarrhea, urgency or  frequency.  When asked him whether he had any __________  seems to  understand the questions.   PHYSICAL EXAMINATION:  On examination [name] Snow is alert, in no apparent  distress, obese.  Blood pressure is 119/73, temperature 97.4, pulse 85.  CHEST:  Clear to auscultation.  Chest exam reveals regular rate and  rhythm.  No murmur.  ABDOMEN:  Soft, positive bowel sounds.  EXTREMITIES:  No edema.   LABS:  White blood cell count is 11.9, hemoglobin 17.7,  hematocrit 73.3  and platelets 396.  Sodium is 159, potassium 4.2, BUN 50, creatinine  2.5.  His creatinine has been as low as 1.69, December of this year when  he was admitted to the hospital.  His __________ , alkaline phosphatase  54, albumin is 3.9, phosphorus 2.4.   ASSESSMENT:  Renal insufficiency at this moment seems to be long-  standing and chronic.  Since [name] Snow has multiple medical problems, the  etiology at this moment is not clear.  His urinalysis during this  admission, specific gravity is 1.03, pH 5, protein 100 mg/dl, but no  other findings.   PROBLEMS:  1. Renal insufficiency at this moment seems to be acute on chronic.      His BUN and creatinine seem to be higher than his baseline.  This      increased and the [name] Snow __________  nausea/vomiting and      ileostomy, probably could be secondary to prerenal syndrome.      However, other etiologies at this moment cannot be ruled out.      Since [name] Snow has long-standing history of chronic renal failure,      etiology for that is not clear, could be secondary to diabetes,      hypertension, ischemic kidney disease, __________  probably might      have contributed to his underlying renal failure.  At this moment      it is not sure whether [name] Snow has any workup.  2. History of hypertension:  Blood pressure seems to be reasonably      controlled.  3. History of gout:  He is on allopurinol.  4. History of pancreatitis.  5. History of dementia:  Probably alcohol-related.  6. History of chronic lymphedema, bilateral:  No significant change.  7. History of total colectomy with ileostomy.  8. History of obesity.  9. History of congestive heart failure:  At this moment he does not      have any sign of a fluid overload.   RECOMMENDATIONS:  At this moment will continue his present management.  Will do an ultrasound of the kidneys, check __________  [name] Snow has any  hydronephrosis as he has history of benign prostatic hypertrophy and  also  neurogenic bladder.  I agree with hydration.  We will follow input  and output.  Hence, we will increase his intravenous fluid to 110 cc per  hour.  Will continue his other treatment and will follow the [name] Snow.      Alison Murray, M.D.  Electronically Signed     BB/MEDQ  D:  08/02/2007  T:  08/02/2007  Job:  [id number]

## 2010-12-16 NOTE — H&P (Signed)
NAMEJADORE, [name] Snow               ACCOUNT NO.:  [id number]   MEDICAL RECORD NO.:  [id number]          PATIENT TYPE:  INP   LOCATION:  [id number]                          FACILITY:  [facility]   PHYSICIAN:  Salem Caster, DO    DATE OF BIRTH:  11-11-1940   DATE OF ADMISSION:  08/01/2007  DATE OF DISCHARGE:  LH                              HISTORY & PHYSICAL   PRIMARY CARE PHYSICIAN:  Norwood Levo. Moshe Cipro, M.D.   CHIEF COMPLAINT:  Abdominal pain with some nausea.   HISTORY OF PRESENT ILLNESS:  [name] Snow is a 70 year old African-  American male with a history of CHF, hypertension, dementia,  hypercholesterolemia, pancreatitis, and colostomy bag who presents with  a 4-day history of inability to keep down any solids or liquids.  Per  family, the patient started having problems with food and liquids this  past Friday.  The patient states that anytime he ingests liquids or  solids, he chokes and it seems to come back up.  The patient does have  history of colostomy which was placed approximately 10 years previously.  The patient was admitted to the hospital on July 23, 2007, and  discharged July 26, 2007, secondary to his chronic lower leg  lymphedema with cellulitis.  The patient was sent home on 7 days of  antibiotics and placed on his home medications.  The patient was doing  well until this past Friday when he began having issues with his  abdomen.  The patient does not admit to any severe abdominal pain and  constant nausea but does have issues when he ingests solids and liquids.  .   ALLERGIES:  No known drug allergies.   FAMILY HISTORY:  Noncontributory.   SOCIAL HISTORY:  Believe the patient lives alone. A sister is his power  of attorney.  Does most of his daily activities.  No history of smoking  or alcohol or drug use.   PAST MEDICAL HISTORY:  Is positive for:  1. Mild dementia.  2. Acid reflux disease.  3. Hard of hearing.  4. Hypercholesterolemia,  5. Hypertension.  6. Pancreatitis.  7. CHF.  8. Colostomy bag.  9. Chronic lymphedema.   CURRENT MEDICATIONS:  1. Lasix 80 mg twice a day.  2. Metoclopramide 5 mg four times a day.  3. Simvastatin 40 mg p.o. at bedtime.  4. Allopurinol 300 mg once a day.  5. Tarka 2/240 mg once a day.  6. Potassium chloride 20 mEq three times a day.  7. The patient was on Augmentin 875 mg twice a day for 7 days.   REVIEW OF SYSTEMS:  GI:  Pertinent positive is inability to keep down  solids and liquids.  GENITOURINARY:  Unremarkable.  NEUROLOGIC:  Unremarkable.  HEENT:  Unremarkable other than hard of hearing.  CARDIOVASCULAR:  Unremarkable.  RESPIRATORY:  Unremarkable   PHYSICAL EXAMINATION:  GENERAL:  The patient appears stated age.  No  acute distress.  The patient is obese, pleasant and cooperative.  He is  also hard of hearing.  VITAL SIGNS:  Temperature is 99, blood pressure 133/79, pulse 96,  respirations 20.  The patient is saturating 100% on room air.  HEENT:  Head is atraumatic, normocephalic.  PERRLA.  EOMI.  NECK:  Soft, supple, nontender, nondistended.  CARDIOVASCULAR:  Regular rate and rhythm.  No murmurs, rubs or gallops.  LUNGS:  Clear to auscultation bilaterally.  No rales, rhonchi or  wheezing.  ABDOMEN:  Obese.  He has a colostomy bag on the right side with some  liquidly stool in place.  No blood is noted.  EXTREMITIES:  He has marked darkened lower extremities like chronic  lichen planus noted.  Right lower extremity is bigger than the left.  No  obvious new signs of any infection  is noted.  The patient has no pain  on palpation.  NEUROLOGIC:  He is alert, hard of hearing, can answer some questions at  times.  No obvious neurological deficits at this time.   LABORATORY DATA:  Sodium 139, potassium 4.3, chloride 100, CO2 26,  glucose 126, BUN 49, creatinine 2.54.  White count 11.9, hemoglobin  13.2, hematocrit 39.3, platelets 396.   RADIOLOGIC STUDIES:  Acute abdominal series showed a  partial small-bowel  obstruction.   CT of the abdomen is pending at this time.   ASSESSMENT:  1. Abdominal pain with inability to tolerate liquids and solids.  2. Renal insufficiency.  3. Leukocytosis.  4. History of congestive heart failure.  5. History of chronic lymphedema leg cellulitis.  6. History of colostomy   PLAN:  1. Abdominal pain with inability to tolerate liquids and solids.  The      patient is in process of obtaining CT of his abdomen.  At this time      we will get a surgery consult to evaluate the patient.  The patient      does have a colostomy in place and functioning.  2. Renal insufficiency.  This also seems to be chronic in nature but      seems to be worsened since the patient was discharged July 26, 2007.  We will get a nephrology consult.  The patient will get IV      fluids as well as Lasix and will await any nephrology      recommendations.  3. The patient has a history of congestive heart failure.  Continue      Lasix at this time.  There is no evidence of any acute heart      failure at this time. Will get a BNP to rule at any acute process.      The patient does not complain of      any shortness of breath or any changes in his lower extremities at      this time.  4. The patient has a history of hypertension, hypercholesteremia and      pancreatitis.  These all seem be stable.  Continue home medications      at this time.      Salem Caster, DO  Electronically Signed     SM/MEDQ  D:  08/01/2007  T:  08/01/2007  Job:  [id number]   cc:   Norwood Levo. Moshe Cipro, M.D.  Fax: [phone]

## 2010-12-16 NOTE — Group Therapy Note (Signed)
NAME:  [name], [name]               ACCOUNT NO.:  [id number]   MEDICAL RECORD NO.:  [id number]          PATIENT TYPE:  INP   LOCATION:  A37                          FACILITY:  [facility]   PHYSICIAN:  Salem Caster, DO    DATE OF BIRTH:  15-May-1941   DATE OF PROCEDURE:  08/02/2007  DATE OF DISCHARGE:                                 PROGRESS NOTE   SUBJECTIVE:  [name] is a 70 year old African-American male with a  history of congestive heart failure, hypertension, dementia,  hypercholesterolemia, pancreatitis, and a colostomy bag who presented  with inability to tolerate solids and liquids.  The patient complained  of these for approximately 4 days.  The patient was seen at [facility] on the 20th of December and discharged on the 23rd of December  with lower leg lymphedema and cellulitis. The patient was placed on  antibiotics and sent home.  The patient then complained of the above  symptoms and was seen in the emergency room and admitted with abdominal  pain.  The patient was also found to have renal insufficiency and  leukocytosis.  Today, the patient seems to be tolerating liquids with a  liquid diet at this time.  Surgery has been consulted secondary to his  abdominal surgery showed a partial bowel obstruction.  There is no need  for any surgical intervention at this time.  The patient, today, is  tolerating his liquids.  Denies any pain.  He seems to be improving.   OBJECTIVE:  VITAL SIGNS:  Temperature is 97.4, respirations 20, heart  rate 85, blood pressure 119/73.  CARDIOVASCULAR:  Regular rate and rhythm.  No murmurs, rubs, or gallops.  LUNGS:  Decreased, but clear. No rales, rhonchi, or wheezes.  ABDOMEN:  Obese.  The patient has a colostomy bag placed. No blood.  EXTREMITIES:  He has marked darkened lower extremities with chronic  lichen planus noted.  Right extremity slightly larger than left.  No  signs of obvious infection.  NEUROLOGIC:  He is alert.  He is  hard of hearing.  He does have some  dementia, but no major neurological defects were noted.   LABS:  Pending at this time.   ASSESSMENT AND PLAN:  1. Inability to tolerate liquids and solids.  He seems to be      improving.  Surgery will increase his diet to a full liquid diet.      They will continue to follow and slowly increase his diet as      tolerated.  2. Renal insufficiency.  Nephrology has been consulted.  The patient's      intravenous fluids have been increased.  The patient has lost      intravenous access.  This will be re-attempted today, but the      patient may need a PICC line if unable to get intravenous access.  3. Leukocytosis.  The patient is empirically placed on antibiotics,      but await urine and blood culture results at this time.  4. The patient does have a history of congestive heart failure,  chronic lymphedema also.      Salem Caster, DO  Electronically Signed     SM/MEDQ  D:  08/02/2007  T:  08/03/2007  Job:  [id number]

## 2010-12-16 NOTE — Discharge Summary (Signed)
[name] Snow, [name] Snow               ACCOUNT NO.:  [id number]   MEDICAL RECORD NO.:  [id number]          PATIENT TYPE:  INP   LOCATION:  [id number]                          FACILITY:  [facility]   PHYSICIAN:  Salem Caster, DO    DATE OF BIRTH:  [birth year]   DATE OF ADMISSION:  08/01/2007  DATE OF DISCHARGE:  01/02/2009LH                               DISCHARGE SUMMARY   DISCHARGE DIAGNOSES:  1. Chronic renal insufficiency.  2. Nausea and vomiting which has resolved.  3. History of __________.  4. History of chronic lymphedema.   BRIEF HOSPITAL COURSE:  [name] Snow is a 70 year old African American  male with a history of CHF, hypertension, dementia,  hypercholesterolemia, pancreatitis, and has __________, who presented  with a four-day history of inability to keep any solids or liquids down.  The family stated that the patient was having problems with foods and  liquids a few days prior to admission.  The patient stated that anytime  he ingests liquids or solids, he had a vomiting episode.  The patient  was admitted to the hospital on July 23, 2007, discharged July 26, 2007, secondary to his lower extremity lymphedema and cellulitis.  He was sent home on seven days of antibiotics.  The patient was doing  well until he had the above issues and was sent to the ER and admitted  to the hospital.   The patient was admitted.  The patient did have a CT scan of his  abdomen.  It showed partial bowel obstruction.  Surgery was consulted,  felt that no surgical intervention was needed.  The patient was started  on full liquid diet.  The patient's symptoms seem to have been improving  over the last few days.  The patient is currently now on soft diet,  doing well.  Due to the patient's renal insufficiency, nephrology was  also consulted.  The patient's IV fluids have been adjusted and his  renal function has been improving daily.  At this time we feel the  patient is stable enough to be  discharged to home.   He will be discharged on the following medications:  Lasix 40 mg one  tablet p.o. twice a day, metoclopramide 5 mg four times a day p.r.n.,  simvastatin 40 mg at bedtime, allopurinol 300 mg daily, Tarka 2/240 mg  daily, potassium chloride 20 mEq twice a day.   Vitals on discharge:  Temperature is 98.3, pulse 87, respirations 20,  blood pressure 129/76.  Last labs:  Sodium 132, potassium 3.7, chloride  99, CO2 25, glucose 127, BUN 37, creatinine 2.18.  Urine culture was  negative.  Blood cultures were negative so far.   CONDITION AT DISCHARGE:  Stable.   DISPOSITION:  The patient will be discharged to home.   CONSULTANTS:  1. General surgery.  2. Nephrology.   DISCHARGE INSTRUCTIONS:  The patient is to maintain low-sodium diet.  Wound care:  The patient is to keep his ostomy bag clean and dry.  Activity:  The patient is to increase activity slowly.  The patient is  to follow up with  Dr. Moshe Cipro in approximately one week, was referred to  Dr. Moshe Cipro regarding the patient's renal status, the patient may need  an outpatient consultation with nephrology.  The patient is to return to  the emergency room for any major problems or contact his primary care  physician.      Salem Caster, DO  Electronically Signed     SM/MEDQ  D:  08/04/2007  T:  08/04/2007  Job:  [id number]   cc:   Moshe Cipro

## 2010-12-19 ENCOUNTER — Encounter (HOSPITAL_COMMUNITY): Payer: Medicare Other

## 2010-12-19 ENCOUNTER — Other Ambulatory Visit: Payer: Self-pay | Admitting: Nephrology

## 2010-12-19 ENCOUNTER — Encounter (HOSPITAL_COMMUNITY): Payer: Medicare Other | Attending: Nephrology

## 2010-12-19 DIAGNOSIS — I129 Hypertensive chronic kidney disease with stage 1 through stage 4 chronic kidney disease, or unspecified chronic kidney disease: Secondary | ICD-10-CM | POA: Insufficient documentation

## 2010-12-19 DIAGNOSIS — D631 Anemia in chronic kidney disease: Secondary | ICD-10-CM | POA: Insufficient documentation

## 2010-12-19 DIAGNOSIS — N189 Chronic kidney disease, unspecified: Secondary | ICD-10-CM | POA: Insufficient documentation

## 2010-12-19 DIAGNOSIS — N039 Chronic nephritic syndrome with unspecified morphologic changes: Secondary | ICD-10-CM | POA: Insufficient documentation

## 2010-12-19 LAB — RENAL FUNCTION PANEL
BUN: 40 mg/dL — ABNORMAL HIGH (ref 6–23)
Chloride: 105 mEq/L (ref 96–112)
Glucose, Bld: 92 mg/dL (ref 70–99)
Potassium: 4.8 mEq/L (ref 3.5–5.1)

## 2010-12-19 LAB — HEMOGLOBIN AND HEMATOCRIT, BLOOD: Hemoglobin: 11 g/dL — ABNORMAL LOW (ref 13.0–17.0)

## 2010-12-19 NOTE — Procedures (Signed)
[name] Snow, [name] Snow               ACCOUNT NO.:  [id number]   MEDICAL RECORD NO.:  [id number]          PATIENT TYPE:  OUT   LOCATION:  RAD                           FACILITY:  [facility]   PHYSICIAN:  Richard A. Rollene Fare, M.D.DATE OF BIRTH:  January 24, 1941   DATE OF PROCEDURE:  DATE OF DISCHARGE:                                ECHOCARDIOGRAM   [name] Snow has a history of hypertension, bilateral lower extremity  edema and dyspnea.  Echocardiogram was done for a presumptive diagnosis  of congestive heart failure.   Echocardiogram was technically difficult.   The aorta was normal at 3.1 cm.  The aortic valve appeared to open  normally.  There was mild aortic sclerosis but no aortic stenosis.  The  aortic valve was trileaflet.   Left atrium was normal at 3.2 cm.  The patient was in sinus rhythm  during the procedure.   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  IVS and posterior wall contraction patterns were normal.   Mitral valve opened normally.  There was mild mitral annular  calcification and mild mitral regurgitation present.  There was no  mitral valve prolapse.   Tricuspid valve appeared normal.  There was mild tricuspid  regurgitation.   Left ventricular internal dimensions were WNL.  LVIDD equaled 4.9 cm and  LVISD equaled 3.5 cm.  No segmental wall motion abnormalities were  visualized.  Overall left ventricular systolic function appear normal  with EF approximately 55%.   Left ventricular inflow signal showed E-to-A reversal compatible with  diastolic relaxation abnormality.   Right ventricle was normal.   There was no pericardial effusion.   The 2-D echocardiogram demonstrates mild to moderate concentric  ventricular hypertrophy with well-preserved LV systolic function and  mild diastolic relaxation abnormality.  There was no significant  valvular abnormality  noted.      Richard A. Rollene Fare, M.D.  Electronically Signed     RAW/MEDQ  D:  12/07/2006  T:  12/07/2006  Job:  [id number]   cc:   Norwood Levo. Moshe Cipro, M.D.  Fax: Walters. Rollene Fare, M.D.  Fax: [phone]

## 2011-01-16 ENCOUNTER — Ambulatory Visit (HOSPITAL_COMMUNITY): Payer: Medicare Other

## 2011-01-20 ENCOUNTER — Other Ambulatory Visit (HOSPITAL_COMMUNITY): Payer: Self-pay | Admitting: Oncology

## 2011-01-20 ENCOUNTER — Encounter (HOSPITAL_COMMUNITY): Payer: Medicare Other | Attending: Nephrology

## 2011-01-20 DIAGNOSIS — D631 Anemia in chronic kidney disease: Secondary | ICD-10-CM | POA: Insufficient documentation

## 2011-01-20 DIAGNOSIS — I129 Hypertensive chronic kidney disease with stage 1 through stage 4 chronic kidney disease, or unspecified chronic kidney disease: Secondary | ICD-10-CM | POA: Insufficient documentation

## 2011-01-20 DIAGNOSIS — N189 Chronic kidney disease, unspecified: Secondary | ICD-10-CM | POA: Insufficient documentation

## 2011-01-20 DIAGNOSIS — N039 Chronic nephritic syndrome with unspecified morphologic changes: Secondary | ICD-10-CM | POA: Insufficient documentation

## 2011-01-20 LAB — RENAL FUNCTION PANEL
CO2: 21 mEq/L (ref 19–32)
Chloride: 104 mEq/L (ref 96–112)
GFR calc Af Amer: 21 mL/min — ABNORMAL LOW (ref >60)
GFR calc non Af Amer: 17 mL/min — ABNORMAL LOW (ref >60)
Sodium: 136 mEq/L (ref 135–145)

## 2011-01-20 LAB — HEMOGLOBIN AND HEMATOCRIT, BLOOD: HCT: 33.8 % — ABNORMAL LOW (ref 39.0–52.0)

## 2011-02-19 ENCOUNTER — Encounter (HOSPITAL_COMMUNITY): Payer: Medicare Other | Attending: Nephrology

## 2011-02-19 DIAGNOSIS — D649 Anemia, unspecified: Secondary | ICD-10-CM | POA: Insufficient documentation

## 2011-02-19 LAB — RENAL FUNCTION PANEL
Calcium: 9.1 mg/dL (ref 8.4–10.5)
GFR calc Af Amer: 22 mL/min — ABNORMAL LOW (ref >60)
GFR calc non Af Amer: 19 mL/min — ABNORMAL LOW (ref >60)
Phosphorus: 3.6 mg/dL (ref 2.3–4.6)
Sodium: 140 mEq/L (ref 135–145)

## 2011-02-19 LAB — HEMOGLOBIN AND HEMATOCRIT, BLOOD
HCT: 34.1 % — ABNORMAL LOW (ref 39.0–52.0)
Hemoglobin: 11.2 g/dL — ABNORMAL LOW (ref 13.0–17.0)

## 2011-02-19 MED ORDER — EPOETIN ALFA 10000 UNIT/ML IJ SOLN
INTRAMUSCULAR | Status: AC
  Filled 2011-02-19: qty 1

## 2011-02-19 MED ORDER — EPOETIN ALFA 10000 UNIT/ML IJ SOLN
10000.0000 [IU] | Freq: Once | INTRAMUSCULAR | Status: AC
  Administered 2011-02-19: 10000 [IU] via SUBCUTANEOUS

## 2011-02-19 NOTE — Progress Notes (Signed)
Venipuncture x 1 for labs. Procrit 10,000 units subcutaneous lower right abd fat. Tolerated well.

## 2011-03-04 ENCOUNTER — Encounter: Payer: Self-pay | Admitting: Family Medicine

## 2011-03-05 ENCOUNTER — Encounter: Payer: Self-pay | Admitting: Family Medicine

## 2011-03-06 ENCOUNTER — Ambulatory Visit (INDEPENDENT_AMBULATORY_CARE_PROVIDER_SITE_OTHER): Payer: Medicare Other | Admitting: Family Medicine

## 2011-03-06 ENCOUNTER — Encounter: Payer: Self-pay | Admitting: Family Medicine

## 2011-03-06 VITALS — BP 150/100 | HR 72 | Resp 16

## 2011-03-06 DIAGNOSIS — N186 End stage renal disease: Secondary | ICD-10-CM

## 2011-03-06 DIAGNOSIS — I10 Essential (primary) hypertension: Secondary | ICD-10-CM

## 2011-03-06 DIAGNOSIS — L97909 Non-pressure chronic ulcer of unspecified part of unspecified lower leg with unspecified severity: Secondary | ICD-10-CM

## 2011-03-06 DIAGNOSIS — E785 Hyperlipidemia, unspecified: Secondary | ICD-10-CM

## 2011-03-06 DIAGNOSIS — R7301 Impaired fasting glucose: Secondary | ICD-10-CM

## 2011-03-06 DIAGNOSIS — I83009 Varicose veins of unspecified lower extremity with ulcer of unspecified site: Secondary | ICD-10-CM

## 2011-03-06 LAB — HEPATIC FUNCTION PANEL
AST: 11 U/L (ref 0–37)
Alkaline Phosphatase: 47 U/L (ref 39–117)
Bilirubin, Direct: 0.2 mg/dL (ref 0.0–0.3)
Total Bilirubin: 0.7 mg/dL (ref 0.3–1.2)

## 2011-03-06 LAB — HEMOGLOBIN A1C: Hgb A1c MFr Bld: 5.8 % — ABNORMAL HIGH (ref <5.7)

## 2011-03-06 LAB — LIPID PANEL: Total CHOL/HDL Ratio: 2.8 Ratio

## 2011-03-06 MED ORDER — SIMVASTATIN 80 MG PO TABS: 80.0000 mg | ORAL_TABLET | Freq: Every day | ORAL | Status: DC

## 2011-03-06 MED ORDER — CLONIDINE HCL 0.2 MG PO TABS: 0.2000 mg | ORAL_TABLET | Freq: Every day | ORAL | Status: DC

## 2011-03-06 MED ORDER — VALSARTAN-HYDROCHLOROTHIAZIDE 320-25 MG PO TABS: 1.0000 | ORAL_TABLET | Freq: Every day | ORAL | Status: DC

## 2011-03-08 NOTE — Patient Instructions (Signed)
F/u in 6 moths, early January.  Labs today and rept labs prior to next visit.   A healthy diet is rich in fruit, vegetables and whole grains. Poultry fish, nuts and beans are a healthy choice for protein rather then red meat. A low sodium diet and drinking 64 ounces of water daily is generally recommended. Oils and sweet should be limited. Carbohydrates especially for those who are diabetic or overweight, should be limited to 34-45 gram per meal. It is important to eat on a regular schedule, at least 3 times daily. Snacks should be primarily fruits, vegetables or nuts.   pls try to lose weight , this will improve your health

## 2011-03-08 NOTE — Assessment & Plan Note (Signed)
Stable, no skin breakdown currently

## 2011-03-08 NOTE — Progress Notes (Signed)
  Subjective:    Patient ID: [name] Snow, male    DOB: February 12, 1941, 70 y.o.   MRN: [id number]  HPI The PT is here for follow up and re-evaluation of chronic medical conditions, medication management and review of any available recent lab and radiology data.  Preventive health is updated, specifically  Cancer screening and Immunization.   Questions or concerns regarding consultations or procedures which the PT has had in the interim are  addressed. The PT denies any adverse reactions to current medications since the last visit.  Concerned that he ha been out of his anti hypertensive medication for several weeks, and today the BP is high. I advised her to call if this remained a problem, and apologized as this should not happen. He has ben evaluated at the wound center and has follow up as needed, no current ulcers     Review of Systems Denies recent fever or chills. Denies sinus pressure, nasal congestion, ear pain or sore throat. Denies chest congestion, productive cough or wheezing. Denies chest pains, palpitations and leg swelling Denies abdominal pain, nausea, vomiting,diarrhea or constipation.  Colostomy ids functioning well Denies dysuria, frequency, hesitancy or incontinence. Wheelchair restricted due to morbid obesity  Denies headaches, seizures, numbness, or tingling. Denies depression, anxiety or insomnia. Denies skin break down, chronic dark rash on legs        Objective:   Physical Exam Patient alert and oriented and in no cardiopulmonary distress.  HEENT: No facial asymmetry, EOMI, no sinus tenderness,  oropharynx pink and moist.  Neck supple no adenopathy.  Chest: Clear to auscultation bilaterally.  CVS: S1, S2 no murmurs, no S3.  ABD: Soft non tender. Bowel sounds normal.  Ext: one plus  edema  MS: decreased  ROM spine, shoulders, hips and knees.  Skin: Intact, hyperpigmented macular rash on lower extremities  Psych: Good eye contact, normal affect.  Memory loss not anxious or depressed appearing.  CNS: CN 2-12 intact      Assessment & Plan:

## 2011-03-08 NOTE — Assessment & Plan Note (Signed)
Controlled, no change in medication  

## 2011-03-08 NOTE — Assessment & Plan Note (Signed)
Pt followed on a regular schedule by nephrology

## 2011-03-19 ENCOUNTER — Encounter (HOSPITAL_COMMUNITY): Payer: Medicare Other | Attending: Nephrology

## 2011-03-19 DIAGNOSIS — N186 End stage renal disease: Secondary | ICD-10-CM | POA: Insufficient documentation

## 2011-03-19 DIAGNOSIS — D649 Anemia, unspecified: Secondary | ICD-10-CM | POA: Insufficient documentation

## 2011-03-19 LAB — HEMOGLOBIN AND HEMATOCRIT, BLOOD: HCT: 33.8 % — ABNORMAL LOW (ref 39.0–52.0)

## 2011-03-19 LAB — RENAL FUNCTION PANEL
Albumin: 3.7 g/dL (ref 3.5–5.2)
CO2: 20 mEq/L (ref 19–32)
Calcium: 9.4 mg/dL (ref 8.4–10.5)
GFR calc Af Amer: 21 mL/min — ABNORMAL LOW (ref >60)
GFR calc non Af Amer: 17 mL/min — ABNORMAL LOW (ref >60)
Sodium: 139 mEq/L (ref 135–145)

## 2011-03-19 MED ORDER — EPOETIN ALFA 10000 UNIT/ML IJ SOLN: 10000.0000 [IU] | INTRAMUSCULAR | Status: DC

## 2011-03-19 MED ORDER — EPOETIN ALFA 10000 UNIT/ML IJ SOLN
10000.0000 [IU] | Freq: Once | INTRAMUSCULAR | Status: AC
  Administered 2011-03-19: 10000 [IU] via SUBCUTANEOUS

## 2011-03-19 MED ORDER — EPOETIN ALFA 10000 UNIT/ML IJ SOLN
INTRAMUSCULAR | Status: AC
  Administered 2011-03-19: 10000 [IU] via SUBCUTANEOUS
  Filled 2011-03-19: qty 1

## 2011-03-19 MED ORDER — EPOETIN ALFA 10000 UNIT/ML IJ SOLN: 10000.0000 [IU] | Freq: Once | INTRAMUSCULAR | Status: DC

## 2011-03-19 NOTE — Progress Notes (Signed)
[name] Snow presents today for injection per MD orders. Procrit 10000 units administered SQ in right Upper Arm. Administration without incident. Patient tolerated well.

## 2011-04-10 ENCOUNTER — Telehealth: Payer: Self-pay | Admitting: Family Medicine

## 2011-04-10 NOTE — Telephone Encounter (Signed)
Returned call, no answer.

## 2011-04-10 NOTE — Telephone Encounter (Signed)
Returned call, left message. 

## 2011-04-16 ENCOUNTER — Encounter (HOSPITAL_COMMUNITY): Payer: Medicare Other | Attending: Nephrology

## 2011-04-16 VITALS — BP 195/70 | HR 70

## 2011-04-16 DIAGNOSIS — N19 Unspecified kidney failure: Secondary | ICD-10-CM

## 2011-04-16 LAB — RENAL FUNCTION PANEL
Albumin: 3.8 g/dL (ref 3.5–5.2)
Calcium: 9.1 mg/dL (ref 8.4–10.5)
Chloride: 109 mEq/L (ref 96–112)
Creatinine, Ser: 3.13 mg/dL — ABNORMAL HIGH (ref 0.50–1.35)
GFR calc non Af Amer: 20 mL/min — ABNORMAL LOW (ref >60)
Phosphorus: 3.1 mg/dL (ref 2.3–4.6)

## 2011-04-16 MED ORDER — EPOETIN ALFA 10000 UNIT/ML IJ SOLN
10000.0000 [IU] | Freq: Once | INTRAMUSCULAR | Status: AC
  Administered 2011-04-16: 10000 [IU] via SUBCUTANEOUS

## 2011-04-16 MED ORDER — EPOETIN ALFA 10000 UNIT/ML IJ SOLN
INTRAMUSCULAR | Status: AC
  Administered 2011-04-16: 10000 [IU] via SUBCUTANEOUS
  Filled 2011-04-16: qty 1

## 2011-04-16 NOTE — Progress Notes (Signed)
[name] Snow presented for labwork. Labs per MD order drawn via Peripheral Line 24 gauge needle inserted in rt ac  Procedure without incident. [id number]}  Patient tolerated procedure well.

## 2011-04-22 LAB — BASIC METABOLIC PANEL
BUN: 30 — ABNORMAL HIGH
BUN: 37 — ABNORMAL HIGH
CO2: 25
Calcium: 8.7
Chloride: 97
Creatinine, Ser: 1.94 — ABNORMAL HIGH
GFR calc non Af Amer: 30 — ABNORMAL LOW
Glucose, Bld: 123 — ABNORMAL HIGH
Glucose, Bld: 127 — ABNORMAL HIGH
Potassium: 3.7
Potassium: 3.8
Sodium: 132 — ABNORMAL LOW

## 2011-05-08 LAB — BASIC METABOLIC PANEL
BUN: 21
BUN: 27 — ABNORMAL HIGH
BUN: 39 — ABNORMAL HIGH
BUN: 50 — ABNORMAL HIGH
CO2: 23
CO2: 25
CO2: 27
CO2: 27
CO2: 29
Calcium: 8.8
Calcium: 9.1
Calcium: 9.2
Chloride: 100
Chloride: 101
Chloride: 101
Chloride: 110
Creatinine, Ser: 1.69 — ABNORMAL HIGH
Creatinine, Ser: 1.75 — ABNORMAL HIGH
Creatinine, Ser: 2.03 — ABNORMAL HIGH
Creatinine, Ser: 2.07 — ABNORMAL HIGH
Creatinine, Ser: 2.58 — ABNORMAL HIGH
GFR calc Af Amer: 30 — ABNORMAL LOW
GFR calc Af Amer: 40 — ABNORMAL LOW
GFR calc Af Amer: 49 — ABNORMAL LOW
GFR calc non Af Amer: 36 — ABNORMAL LOW
GFR calc non Af Amer: 41 — ABNORMAL LOW
Glucose, Bld: 117 — ABNORMAL HIGH
Potassium: 4.2
Potassium: 4.3
Sodium: 138

## 2011-05-08 LAB — DIFFERENTIAL
Basophils Absolute: 0
Basophils Absolute: 0
Basophils Relative: 0
Basophils Relative: 1
Basophils Relative: 2 — ABNORMAL HIGH
Eosinophils Absolute: 0
Eosinophils Absolute: 0.1
Eosinophils Absolute: 0.4
Eosinophils Relative: 0
Eosinophils Relative: 4
Eosinophils Relative: 5
Lymphocytes Relative: 31
Lymphs Abs: 1.3
Lymphs Abs: 1.7
Monocytes Absolute: 0.7
Monocytes Absolute: 1.8 — ABNORMAL HIGH
Monocytes Relative: 11
Monocytes Relative: 11
Monocytes Relative: 15 — ABNORMAL HIGH
Monocytes Relative: 15 — ABNORMAL HIGH
Neutro Abs: 3.8
Neutro Abs: 4.6
Neutrophils Relative %: 49
Neutrophils Relative %: 55
Neutrophils Relative %: 62

## 2011-05-08 LAB — B-NATRIURETIC PEPTIDE (CONVERTED LAB): Pro B Natriuretic peptide (BNP): <30

## 2011-05-08 LAB — CBC
HCT: 31.3 — ABNORMAL LOW
Hemoglobin: 10.4 — ABNORMAL LOW
MCHC: 32.6
MCHC: 33.1
MCHC: 33.1
MCHC: 33.2
MCHC: 33.6
MCV: 79.4
MCV: 80.1
MCV: 80.7
Platelets: 339
Platelets: 347
Platelets: 396
RBC: 4.11 — ABNORMAL LOW
RBC: 4.45
RBC: 4.52
RBC: 4.92
RDW: 15.9 — ABNORMAL HIGH
RDW: 15.9 — ABNORMAL HIGH
RDW: 16.4 — ABNORMAL HIGH
RDW: 16.8 — ABNORMAL HIGH
WBC: 5.5

## 2011-05-08 LAB — URINALYSIS, ROUTINE W REFLEX MICROSCOPIC
Bilirubin Urine: NEGATIVE
Glucose, UA: NEGATIVE
Glucose, UA: NEGATIVE
Hgb urine dipstick: NEGATIVE
Leukocytes, UA: NEGATIVE
Nitrite: NEGATIVE
Protein, ur: 100 — AB
Protein, ur: NEGATIVE
Protein, ur: NEGATIVE
Urobilinogen, UA: 0.2
pH: 5

## 2011-05-08 LAB — CULTURE, BLOOD (ROUTINE X 2)
Culture: NO GROWTH
Culture: NO GROWTH
Report Status: 1032009
Report Status: 1032009

## 2011-05-08 LAB — URINE MICROSCOPIC-ADD ON

## 2011-05-08 LAB — COMPREHENSIVE METABOLIC PANEL
ALT: 22
AST: 38 — ABNORMAL HIGH
Albumin: 3.9
CO2: 26
Calcium: 9.4
GFR calc Af Amer: 31 — ABNORMAL LOW
Sodium: 139
Total Protein: 6.1

## 2011-05-08 LAB — PTH, INTACT AND CALCIUM
Calcium, Total (PTH): 8.6
PTH: 301.1 — ABNORMAL HIGH

## 2011-05-08 LAB — URINE CULTURE: Special Requests: NEGATIVE

## 2011-05-08 LAB — TSH: TSH: 1.602

## 2011-05-08 LAB — PROTIME-INR
INR: 1
Prothrombin Time: 13.9
Prothrombin Time: >90 — ABNORMAL HIGH

## 2011-05-08 LAB — POTASSIUM: Potassium: 4.6

## 2011-05-19 ENCOUNTER — Other Ambulatory Visit (HOSPITAL_COMMUNITY): Payer: Self-pay | Admitting: Oncology

## 2011-05-19 DIAGNOSIS — Z79899 Other long term (current) drug therapy: Secondary | ICD-10-CM

## 2011-05-19 DIAGNOSIS — E559 Vitamin D deficiency, unspecified: Secondary | ICD-10-CM

## 2011-05-19 DIAGNOSIS — R809 Proteinuria, unspecified: Secondary | ICD-10-CM

## 2011-05-19 DIAGNOSIS — D649 Anemia, unspecified: Secondary | ICD-10-CM

## 2011-05-19 DIAGNOSIS — N2581 Secondary hyperparathyroidism of renal origin: Secondary | ICD-10-CM

## 2011-05-19 DIAGNOSIS — N189 Chronic kidney disease, unspecified: Secondary | ICD-10-CM

## 2011-05-25 ENCOUNTER — Encounter (HOSPITAL_COMMUNITY): Payer: Medicare Other | Attending: Nephrology

## 2011-05-25 DIAGNOSIS — Z79899 Other long term (current) drug therapy: Secondary | ICD-10-CM

## 2011-05-25 DIAGNOSIS — N189 Chronic kidney disease, unspecified: Secondary | ICD-10-CM

## 2011-05-25 DIAGNOSIS — E559 Vitamin D deficiency, unspecified: Secondary | ICD-10-CM

## 2011-05-25 DIAGNOSIS — N2581 Secondary hyperparathyroidism of renal origin: Secondary | ICD-10-CM

## 2011-05-25 DIAGNOSIS — D649 Anemia, unspecified: Secondary | ICD-10-CM

## 2011-05-25 DIAGNOSIS — R809 Proteinuria, unspecified: Secondary | ICD-10-CM

## 2011-05-25 LAB — CBC
Hemoglobin: 11.9 g/dL — ABNORMAL LOW (ref 13.0–17.0)
Platelets: 239 10*3/uL (ref 150–400)
RBC: 4.57 MIL/uL (ref 4.22–5.81)
WBC: 5.1 10*3/uL (ref 4.0–10.5)

## 2011-05-25 LAB — RENAL FUNCTION PANEL
Albumin: 3.9 g/dL (ref 3.5–5.2)
CO2: 24 mEq/L (ref 19–32)
Chloride: 100 mEq/L (ref 96–112)
GFR calc non Af Amer: 16 mL/min — ABNORMAL LOW (ref >90)
Potassium: 4.7 mEq/L (ref 3.5–5.1)

## 2011-05-25 MED ORDER — EPOETIN ALFA 10000 UNIT/ML IJ SOLN
10000.0000 [IU] | Freq: Once | INTRAMUSCULAR | Status: AC
  Administered 2011-05-25: 10000 [IU] via INTRAVENOUS

## 2011-05-25 MED ORDER — EPOETIN ALFA 10000 UNIT/ML IJ SOLN
INTRAMUSCULAR | Status: AC
  Administered 2011-05-25: 10000 [IU] via INTRAVENOUS
  Filled 2011-05-25: qty 1

## 2011-05-25 NOTE — Progress Notes (Signed)
[name] Snow presents today for injection per MD orders. Procrit 10000 units administered SQ in right Upper Arm. Administration without incident. Patient tolerated well. [name] Snow presented for labwork. Labs per MD order drawn via Peripheral Line 25 gauge needle inserted in rt arm  Good blood return present. Procedure without incident.  Needle removed intact. Patient tolerated procedure well.

## 2011-05-26 LAB — VITAMIN D 25 HYDROXY (VIT D DEFICIENCY, FRACTURES): Vit D, 25-Hydroxy: 22 ng/mL — ABNORMAL LOW (ref 30–89)

## 2011-05-26 LAB — PTH, INTACT AND CALCIUM
Calcium, Total (PTH): 9.4 mg/dL (ref 8.4–10.5)
PTH: 277.4 pg/mL — ABNORMAL HIGH (ref 14.0–72.0)

## 2011-05-28 ENCOUNTER — Ambulatory Visit (HOSPITAL_COMMUNITY): Payer: Medicare Other

## 2011-06-29 ENCOUNTER — Encounter (HOSPITAL_COMMUNITY): Payer: Medicare Other | Attending: Nephrology

## 2011-06-29 VITALS — BP 175/86 | HR 72

## 2011-06-29 DIAGNOSIS — N189 Chronic kidney disease, unspecified: Secondary | ICD-10-CM

## 2011-06-29 DIAGNOSIS — D649 Anemia, unspecified: Secondary | ICD-10-CM

## 2011-06-29 DIAGNOSIS — R7989 Other specified abnormal findings of blood chemistry: Secondary | ICD-10-CM | POA: Insufficient documentation

## 2011-06-29 LAB — PROTEIN / CREATININE RATIO, URINE
Creatinine, Urine: 99.04 mg/dL
Protein Creatinine Ratio: 0.8 — ABNORMAL HIGH (ref 0.00–0.15)
Total Protein, Urine: 79 mg/dL

## 2011-06-29 MED ORDER — EPOETIN ALFA 10000 UNIT/ML IJ SOLN
10000.0000 [IU] | Freq: Once | INTRAMUSCULAR | Status: AC
  Administered 2011-06-29: 10000 [IU] via SUBCUTANEOUS

## 2011-06-29 MED ORDER — EPOETIN ALFA 10000 UNIT/ML IJ SOLN
INTRAMUSCULAR | Status: AC
  Administered 2011-06-29: 10000 [IU] via SUBCUTANEOUS
  Filled 2011-06-29: qty 1

## 2011-06-29 NOTE — Progress Notes (Signed)
Addended by: Desiree Hane C on: 06/29/2011 11:45 AM   Modules accepted: Orders

## 2011-06-29 NOTE — Progress Notes (Signed)
Tolerated injection well. 

## 2011-06-29 NOTE — Progress Notes (Signed)
Addended by: Berneta Levins on: 06/29/2011 12:20 PM   Modules accepted: Orders

## 2011-07-23 ENCOUNTER — Other Ambulatory Visit: Payer: Self-pay | Admitting: Family Medicine

## 2011-07-24 LAB — HEPATIC FUNCTION PANEL
ALT: 8 U/L (ref 0–53)
Alkaline Phosphatase: 50 U/L (ref 39–117)
Bilirubin, Direct: 0.1 mg/dL (ref 0.0–0.3)
Indirect Bilirubin: 0.4 mg/dL (ref 0.0–0.9)
Total Protein: 6.9 g/dL (ref 6.0–8.3)

## 2011-07-24 LAB — LIPID PANEL
Cholesterol: 190 mg/dL (ref 0–200)
Triglycerides: 70 mg/dL (ref <150)
VLDL: 14 mg/dL (ref 0–40)

## 2011-07-24 LAB — HEMOGLOBIN A1C: Mean Plasma Glucose: 123 mg/dL — ABNORMAL HIGH (ref <117)

## 2011-07-29 ENCOUNTER — Ambulatory Visit (HOSPITAL_COMMUNITY): Payer: Medicare Other

## 2011-08-05 ENCOUNTER — Emergency Department (HOSPITAL_COMMUNITY): Payer: Medicare Other

## 2011-08-05 ENCOUNTER — Ambulatory Visit (HOSPITAL_COMMUNITY): Payer: Medicare Other

## 2011-08-05 ENCOUNTER — Encounter (HOSPITAL_COMMUNITY): Payer: Self-pay | Admitting: *Deleted

## 2011-08-05 ENCOUNTER — Encounter: Payer: Self-pay | Admitting: Family Medicine

## 2011-08-05 ENCOUNTER — Inpatient Hospital Stay (HOSPITAL_COMMUNITY)
Admission: EM | Admit: 2011-08-05 | Discharge: 2011-08-13 | DRG: 388 | Disposition: A | Discharge status: Skilled Nursing Facility | Payer: Medicare Other | Attending: Internal Medicine | Admitting: Internal Medicine

## 2011-08-05 DIAGNOSIS — L089 Local infection of the skin and subcutaneous tissue, unspecified: Secondary | ICD-10-CM

## 2011-08-05 DIAGNOSIS — J9819 Other pulmonary collapse: Secondary | ICD-10-CM | POA: Diagnosis present

## 2011-08-05 DIAGNOSIS — F1021 Alcohol dependence, in remission: Secondary | ICD-10-CM

## 2011-08-05 DIAGNOSIS — Z6841 Body Mass Index (BMI) 40.0 and over, adult: Secondary | ICD-10-CM

## 2011-08-05 DIAGNOSIS — K565 Intestinal adhesions [bands], unspecified as to partial versus complete obstruction: Principal | ICD-10-CM | POA: Diagnosis present

## 2011-08-05 DIAGNOSIS — I83009 Varicose veins of unspecified lower extremity with ulcer of unspecified site: Secondary | ICD-10-CM | POA: Diagnosis present

## 2011-08-05 DIAGNOSIS — J9811 Atelectasis: Secondary | ICD-10-CM

## 2011-08-05 DIAGNOSIS — I120 Hypertensive chronic kidney disease with stage 5 chronic kidney disease or end stage renal disease: Secondary | ICD-10-CM | POA: Diagnosis present

## 2011-08-05 DIAGNOSIS — D649 Anemia, unspecified: Secondary | ICD-10-CM | POA: Diagnosis present

## 2011-08-05 DIAGNOSIS — K56609 Unspecified intestinal obstruction, unspecified as to partial versus complete obstruction: Secondary | ICD-10-CM | POA: Diagnosis present

## 2011-08-05 DIAGNOSIS — E785 Hyperlipidemia, unspecified: Secondary | ICD-10-CM

## 2011-08-05 DIAGNOSIS — D631 Anemia in chronic kidney disease: Secondary | ICD-10-CM | POA: Diagnosis present

## 2011-08-05 DIAGNOSIS — L0291 Cutaneous abscess, unspecified: Secondary | ICD-10-CM

## 2011-08-05 DIAGNOSIS — I10 Essential (primary) hypertension: Secondary | ICD-10-CM

## 2011-08-05 DIAGNOSIS — R5381 Other malaise: Secondary | ICD-10-CM

## 2011-08-05 DIAGNOSIS — N039 Chronic nephritic syndrome with unspecified morphologic changes: Secondary | ICD-10-CM | POA: Diagnosis present

## 2011-08-05 DIAGNOSIS — R5383 Other fatigue: Secondary | ICD-10-CM

## 2011-08-05 DIAGNOSIS — J309 Allergic rhinitis, unspecified: Secondary | ICD-10-CM

## 2011-08-05 DIAGNOSIS — K861 Other chronic pancreatitis: Secondary | ICD-10-CM

## 2011-08-05 DIAGNOSIS — D509 Iron deficiency anemia, unspecified: Secondary | ICD-10-CM | POA: Diagnosis present

## 2011-08-05 DIAGNOSIS — N186 End stage renal disease: Secondary | ICD-10-CM

## 2011-08-05 DIAGNOSIS — E872 Acidosis, unspecified: Secondary | ICD-10-CM

## 2011-08-05 DIAGNOSIS — Z933 Colostomy status: Secondary | ICD-10-CM

## 2011-08-05 DIAGNOSIS — L97909 Non-pressure chronic ulcer of unspecified part of unspecified lower leg with unspecified severity: Secondary | ICD-10-CM

## 2011-08-05 DIAGNOSIS — K5989 Other specified functional intestinal disorders: Secondary | ICD-10-CM | POA: Diagnosis present

## 2011-08-05 DIAGNOSIS — L0390 Cellulitis, unspecified: Secondary | ICD-10-CM

## 2011-08-05 DIAGNOSIS — I872 Venous insufficiency (chronic) (peripheral): Secondary | ICD-10-CM | POA: Diagnosis present

## 2011-08-05 DIAGNOSIS — R7989 Other specified abnormal findings of blood chemistry: Secondary | ICD-10-CM

## 2011-08-05 HISTORY — DX: Dependence on renal dialysis: Z99.2

## 2011-08-05 HISTORY — DX: Acidosis: E87.2

## 2011-08-05 LAB — HEPATIC FUNCTION PANEL
ALT: 10 U/L (ref 0–53)
AST: 17 U/L (ref 0–37)
Albumin: 3.8 g/dL (ref 3.5–5.2)
Alkaline Phosphatase: 55 U/L (ref 39–117)
Bilirubin, Direct: 0.1 mg/dL (ref 0.0–0.3)
Total Bilirubin: 0.5 mg/dL (ref 0.3–1.2)

## 2011-08-05 LAB — BASIC METABOLIC PANEL
BUN: 39 mg/dL — ABNORMAL HIGH (ref 6–23)
CO2: 22 mEq/L (ref 19–32)
Chloride: 102 mEq/L (ref 96–112)
Creatinine, Ser: 3.03 mg/dL — ABNORMAL HIGH (ref 0.50–1.35)
Glucose, Bld: 149 mg/dL — ABNORMAL HIGH (ref 70–99)
Potassium: 5 mEq/L (ref 3.5–5.1)

## 2011-08-05 LAB — CBC
HCT: 40.4 % (ref 39.0–52.0)
Hemoglobin: 13.1 g/dL (ref 13.0–17.0)
MCHC: 32.4 g/dL (ref 30.0–36.0)
MCV: 80.8 fL (ref 78.0–100.0)
RDW: 15.7 % — ABNORMAL HIGH (ref 11.5–15.5)

## 2011-08-05 LAB — DIFFERENTIAL
Basophils Relative: 0 % (ref 0–1)
Lymphs Abs: 0.3 10*3/uL — ABNORMAL LOW (ref 0.7–4.0)
Monocytes Absolute: 1 10*3/uL (ref 0.1–1.0)
Monocytes Relative: 8 % (ref 3–12)
Neutro Abs: 11.2 10*3/uL — ABNORMAL HIGH (ref 1.7–7.7)
Neutrophils Relative %: 90 % — ABNORMAL HIGH (ref 43–77)

## 2011-08-05 MED ORDER — MORPHINE SULFATE 2 MG/ML IJ SOLN
2.0000 mg | INTRAMUSCULAR | Status: DC | PRN
  Administered 2011-08-05 – 2011-08-11 (×7): 2 mg via INTRAVENOUS
  Filled 2011-08-05 (×10): qty 1

## 2011-08-05 MED ORDER — CLONIDINE HCL 0.2 MG/24HR TD PTWK
0.2000 mg | MEDICATED_PATCH | TRANSDERMAL | Status: DC
  Administered 2011-08-05: 0.2 mg via TRANSDERMAL
  Filled 2011-08-05: qty 1

## 2011-08-05 MED ORDER — METOPROLOL TARTRATE 1 MG/ML IV SOLN
5.0000 mg | Freq: Four times a day (QID) | INTRAVENOUS | Status: DC
  Administered 2011-08-05 – 2011-08-08 (×11): 5 mg via INTRAVENOUS
  Filled 2011-08-05 (×8): qty 5
  Filled 2011-08-05: qty 10

## 2011-08-05 MED ORDER — ACETAMINOPHEN 325 MG PO TABS
650.0000 mg | ORAL_TABLET | Freq: Four times a day (QID) | ORAL | Status: DC | PRN
  Administered 2011-08-08: 650 mg via ORAL
  Filled 2011-08-05: qty 2

## 2011-08-05 MED ORDER — ONDANSETRON HCL 4 MG/2ML IJ SOLN
4.0000 mg | Freq: Four times a day (QID) | INTRAMUSCULAR | Status: DC | PRN
  Administered 2011-08-05 – 2011-08-09 (×3): 4 mg via INTRAVENOUS
  Filled 2011-08-05 (×3): qty 2

## 2011-08-05 MED ORDER — ONDANSETRON HCL 4 MG/2ML IJ SOLN
4.0000 mg | Freq: Once | INTRAMUSCULAR | Status: AC
  Administered 2011-08-05: 4 mg via INTRAVENOUS
  Filled 2011-08-05: qty 2

## 2011-08-05 MED ORDER — HYDRALAZINE HCL 20 MG/ML IJ SOLN: 5.0000 mg | Freq: Three times a day (TID) | INTRAMUSCULAR | Status: DC | PRN

## 2011-08-05 MED ORDER — PANTOPRAZOLE SODIUM 40 MG IV SOLR
40.0000 mg | INTRAVENOUS | Status: DC
  Administered 2011-08-05 – 2011-08-08 (×4): 40 mg via INTRAVENOUS
  Filled 2011-08-05 (×5): qty 40

## 2011-08-05 MED ORDER — ACETAMINOPHEN 650 MG RE SUPP: 650.0000 mg | Freq: Four times a day (QID) | RECTAL | Status: DC | PRN

## 2011-08-05 MED ORDER — SODIUM CHLORIDE 0.9 % IV SOLN
INTRAVENOUS | Status: DC
  Administered 2011-08-05: 20 mL/h via INTRAVENOUS
  Administered 2011-08-07: 10 mL/h via INTRAVENOUS
  Administered 2011-08-09 – 2011-08-11 (×3): via INTRAVENOUS

## 2011-08-05 MED ORDER — ONDANSETRON HCL 4 MG PO TABS: 4.0000 mg | ORAL_TABLET | Freq: Four times a day (QID) | ORAL | Status: DC | PRN

## 2011-08-05 NOTE — ED Notes (Signed)
Report attempted for [name] Snow. Will call back

## 2011-08-05 NOTE — H&P (Signed)
PCP:   Tula Nakayama, MD, MD   Chief Complaint:  Abdominal pain  HPI: Patient is a 71 year old African American male with past medical history of subtotal colectomy and colostomy as well as previous chronic pancreatitis, hypertension and near end-stage renal disease but not yet on dialysis who for the last 24 hours has had increasing nausea vomiting and foul-smelling material coming from his colostomy. He came into the emergency room for further evaluation and was noted to have a white count of 12 and a CT scan of the abdomen pelvis noting high-grade small bowel obstruction. Patient has had previous episode of this which is able to resolved without surgery. General surgery was consult to evaluate the patient and hospitals were called for admission given his chronic medical issues. Patient was made a transfer to the floor and I saw him after he arrives on the floor status post an NG tube being placed.  The patient himself is doing okay, says that he has no pain and is currently not nauseated. He denies any headaches, vision changes, dysphasia, chest pain, palpitations, shortness of breath, wheeze, cough, abdominal pain currently, hematuria, dysuria, constipation or focal steady numbness, weakness or pain. He does have some mild nausea this is improved. Review systems otherwise negative.    Past Medical History: Past Medical History  Diagnosis Date  . Chronic pancreatitis   . Hyperlipidemia   . Colostomy in place   . Intestinal obstruction   . Alcohol abuse   . Morbid obesity   . Hypertension   . Dialysis care    Past Surgical History  Procedure Date  . Colectomy   . Partial toenail removal  of the right great toe secondary to recurrent infection from ingrown toenail     Medications: Prior to Admission medications   Medication Sig Start Date End Date Taking? Authorizing Provider  allopurinol (ZYLOPRIM) 300 MG tablet TAKE 1 TABLET EVERY DAY FOR GOUT 11/04/10  Yes Tula Nakayama, MD    cloNIDine (CATAPRES) 0.2 MG tablet Take 0.2 mg by mouth at bedtime.   03/06/11  Yes Tula Nakayama, MD  ferrous sulfate ([facility] IRON) 325 (65 FE) MG tablet Take 325 mg by mouth 2 (two) times daily. Take one tablet by mouth twice a day    Yes Historical Provider, MD  torsemide (DEMADEX) 20 MG tablet Take 20 mg by mouth daily. Take 2 tablets by mouth once daily    Yes Historical Provider, MD  valsartan-hydrochlorothiazide (DIOVAN HCT) 320-25 MG per tablet Take 1 tablet by mouth daily. 03/06/11 03/05/12 Yes Tula Nakayama, MD    Allergies:  No Known Allergies  Social History:  reports that he has never smoked. He does not have any smokeless tobacco history on file. He reports that he does not drink alcohol or use illicit drugs. The patient is normally at baseline able to participate in almost all activities of daily living without help. He lives at home with family.  Family History: Family History  Problem Relation Age of Onset  . Hypertension Brother     Physical Exam: Filed Vitals:   08/05/11 1300 08/05/11 1400 08/05/11 1630 08/05/11 1850  BP: 188/98 172/77 163/78 195/67  Pulse:   78 95  Temp:    99.9 F (37.7 C)  TempSrc:    Axillary  Resp: 29 33 18 24  Height:    5\' 6"  (1.676 m)  Weight:    148.961 kg (328 lb 6.4 oz)  SpO2:   92% 97%   General: Alert and  oriented x3, no acute distress, looks about stated age, fatigued. HEENT: The massage, atraumatic, he has an NG tube in place, mucous members are dry Cardiovascular: Regular rate and rhythm, [id number], soft 2/6 systolic ejection murmur Lungs:" Bilaterally Abdomen: Morbidly obese, nontender, no bowel sounds, colostomy in the right lower Cordran with brown-yellow material inside. Extremities: No clubbing, 1+ pitting edema from the mid lower leg down, chronic venous stasis bilaterally, poor peripheral pulses   Labs on Admission:   [facility] 08/05/11 1024  NA 138  K 5.0  CL 102  CO2 22  GLUCOSE 149*  BUN 39*  CREATININE 3.03*   CALCIUM 10.0  MG --  PHOS --    Basename 08/05/11 1024  AST 17  ALT 10  ALKPHOS 55  BILITOT 0.5  PROT 7.9  ALBUMIN 3.8    Basename 08/05/11 1024  LIPASE 55  AMYLASE --    Basename 08/05/11 1024  WBC 12.5*  NEUTROABS 11.2*  HGB 13.1  HCT 40.4  MCV 80.8  PLT 206    Radiological Exams on Admission: Ct Abdomen Pelvis Wo Contrast  08/05/2011   IMPRESSION: High-grade small bowel obstruction, probably due to adhesions. Previous sub total colectomy.  Ostomy in the right abdomen.  Atrophic right kidney containing small nonobstructing calculi.  No gross abnormality of the left kidney.     Assessment/Plan Present on Admission:  .HYPERLIPIDEMIA: Stable holding oral medications.  Marland KitchenHYPERTENSION: Her pressures markedly elevated, likely from being unable to take by mouth. Have put him on IV Lopressor plus clonidine patch plus when necessary IV hydralazine.  .End stage renal disease: He has fistula in place, but not yet being started. Will monitor renal function. Still able to make urine.  .OBESITY, MORBID: Stable.  .VENOUS STASIS ULCER, CHRONIC: Stable.  .SBO (small bowel obstruction): Surgery following as well. Repeat abdominal series in the morning. Continue NG tube.  Patient is to be a full code. We'll respect his wishes..  I anticipate her length of stay to be 2-3 days based on improvement of small bowel obstruction.  Time spent on this patient including examination and decision-making process: 40 minutes.  Annita Brod [id number] 08/05/2011, 8:14 PM

## 2011-08-05 NOTE — ED Notes (Signed)
NT tube placed with immediate brown liquid suctioned on LIS. Pt tolerated placement well.

## 2011-08-05 NOTE — Consult Note (Signed)
Reason for Consult: Small bowel obstruction Referring Physician: ER, [facility]  [name] Snow is an 71 y.o. male.  HPI: Patient is a 71 year old obese black male who presents with 24-hour history of worsening nausea, vomiting, and diarrhea. He is status post a subtotal colectomy for an unknown etiology over 10 years ago. He has a colostomy. He does not know why he was not reconnected. He states he has had ostomy output, but he has had profuse nausea and vomiting. On the emergency room, he vomited feculent smelling material. A CT scan the abdomen and pelvis was performed which revealed a high-grade small bowel obstruction. He has had an episode in the past, which resolved without surgery.  Past Medical History  Diagnosis Date  . Chronic pancreatitis   . Hyperlipidemia   . Colostomy in place   . Intestinal obstruction   . Alcohol abuse   . Morbid obesity   . Hypertension   . Dialysis care     Past Surgical History  Procedure Date  . Colectomy   . Partial toenail removal  of the right great toe secondary to recurrent infection from ingrown toenail     Family History  Problem Relation Age of Onset  . Hypertension Brother     Social History:  reports that he has never smoked. He does not have any smokeless tobacco history on file. He reports that he does not drink alcohol or use illicit drugs.  Allergies: No Known Allergies  Medications: I have reviewed the patient's current medications.  Results for orders placed during the hospital encounter of 08/05/11 (from the past 48 hour(s))  CBC     Status: Abnormal   Collection Time   08/05/11 10:24 AM      Component Value Range Comment   WBC 12.5 (*) 4.0 - 10.5 (K/uL)    RBC 5.00  4.22 - 5.81 (MIL/uL)    Hemoglobin 13.1  13.0 - 17.0 (g/dL)    HCT 40.4  39.0 - 52.0 (%)    MCV 80.8  78.0 - 100.0 (fL)    MCH 26.2  26.0 - 34.0 (pg)    MCHC 32.4  30.0 - 36.0 (g/dL)    RDW 15.7 (*) 11.5 - 15.5 (%)    Platelets 206  150 - 400  (K/uL)   DIFFERENTIAL     Status: Abnormal   Collection Time   08/05/11 10:24 AM      Component Value Range Comment   Neutrophils Relative 90 (*) 43 - 77 (%)    Neutro Abs 11.2 (*) 1.7 - 7.7 (K/uL)    Lymphocytes Relative 2 (*) 12 - 46 (%)    Lymphs Abs 0.3 (*) 0.7 - 4.0 (K/uL)    Monocytes Relative 8  3 - 12 (%)    Monocytes Absolute 1.0  0.1 - 1.0 (K/uL)    Eosinophils Relative 0  0 - 5 (%)    Eosinophils Absolute 0.0  0.0 - 0.7 (K/uL)    Basophils Relative 0  0 - 1 (%)    Basophils Absolute 0.0  0.0 - 0.1 (K/uL)   BASIC METABOLIC PANEL     Status: Abnormal   Collection Time   08/05/11 10:24 AM      Component Value Range Comment   Sodium 138  135 - 145 (mEq/L)    Potassium 5.0  3.5 - 5.1 (mEq/L)    Chloride 102  96 - 112 (mEq/L)    CO2 22  19 - 32 (mEq/L)  Glucose, Bld 149 (*) 70 - 99 (mg/dL)    BUN 39 (*) 6 - 23 (mg/dL)    Creatinine, Ser 3.03 (*) 0.50 - 1.35 (mg/dL)    Calcium 10.0  8.4 - 10.5 (mg/dL)    GFR calc non Af Amer 19 (*) >90 (mL/min)    GFR calc Af Amer 23 (*) >90 (mL/min)   LIPASE, BLOOD     Status: Normal   Collection Time   08/05/11 10:24 AM      Component Value Range Comment   Lipase 55  11 - 59 (U/L)   HEPATIC FUNCTION PANEL     Status: Normal   Collection Time   08/05/11 10:24 AM      Component Value Range Comment   Total Protein 7.9  6.0 - 8.3 (g/dL)    Albumin 3.8  3.5 - 5.2 (g/dL)    AST 17  0 - 37 (U/L)    ALT 10  0 - 53 (U/L)    Alkaline Phosphatase 55  39 - 117 (U/L)    Total Bilirubin 0.5  0.3 - 1.2 (mg/dL)    Bilirubin, Direct 0.1  0.0 - 0.3 (mg/dL)    Indirect Bilirubin 0.4  0.3 - 0.9 (mg/dL)     Ct Abdomen Pelvis Wo Contrast  08/05/2011  *RADIOLOGY REPORT*  Clinical Data: Emesis.  History of pancreatitis.  Abnormal renal function.  CT ABDOMEN AND PELVIS WITHOUT CONTRAST  Technique:  Multidetector CT imaging of the abdomen and pelvis was performed following the standard protocol without intravenous contrast.  Comparison: 08/01/2007  Findings:  There is mild patchy atelectasis at both lung bases. Pneumonia is not excluded.  No pleural or pericardial fluid.  The liver has a normal appearance without contrast.  There is been previous cholecystectomy.  The spleen is normal.  The pancreas is normal.  The adrenal glands are normal.  The right kidney is atrophic and contains  small nonobstructing calculi.  The left kidney is within normal limits in size and does not show any focal lesions.  No sign of obstruction.  There is marked distention of the stomach and the proximal small intestine consistent with high-grade small bowel obstruction.  The patient appears to have had sub total colectomy.  There is an ostomy in the right abdomen.  There is no evidence of bowel perforation.  No evidence of pelvic mass or adenopathy.  IMPRESSION: High-grade small bowel obstruction, probably due to adhesions. Previous sub total colectomy.  Ostomy in the right abdomen.  Atrophic right kidney containing small nonobstructing calculi.  No gross abnormality of the left kidney.  Original Report Authenticated By: Jules Schick, M.D.    ROS: See chart Blood pressure 172/77, pulse 90, temperature 99.5 F (37.5 C), temperature source Oral, resp. rate 33, height 5\' 6"  (1.676 m), weight 148.78 kg (328 lb), SpO2 95.00%. Physical Exam: Obese black male in no acute distress. NG tube in place. Abdomen: Soft, questionable distention. There is some stool in the ostomy bag in the right lower quadrant of the abdomen. No rigidity or tenderness is noted. No hernias are noted. Rectal examination was deferred at this time. No hepatosplenomegaly or masses are noted.  Assessment/Plan: Impression: Small bowel obstruction Plan: Agree with nasogastric tube decompression at this point. The patient admitted by the hospitalist do to the renal insufficiency. He does have a dialysis fistula in place, but that is not being used at this point. Hopefully, this will resolve with conservative measures. I  will follow patient closely  with you.  Laquilla Dault A 08/05/2011, 5:32 PM

## 2011-08-05 NOTE — ED Notes (Signed)
Called CT for results. Per Tammy in CT will call back concerning images.

## 2011-08-05 NOTE — Progress Notes (Signed)
1600 Assumed care/disposition of patient. Patient with colostomy due to bowel obstruction 8-10 years ago. Now with nausea, vomiting since yesterday. Emesis smells of feces. Awaiting CT scan. Patient will require admission. Ordered NG tube be placed 1700 CT results with high grade SBO. Spoke with Dr. Arnoldo Morale who will see patient. He asked that medicine admit the patient. [place] with Dr. [place] Pink, hospitalist. He will admit the patient. Asked that we request a med surg bed.Temporary admission orders placed. Ct Abdomen Pelvis Wo Contrast  08/05/2011  *RADIOLOGY REPORT*  Clinical Data: Emesis.  History of pancreatitis.  Abnormal renal function.  CT ABDOMEN AND PELVIS WITHOUT CONTRAST  Technique:  Multidetector CT imaging of the abdomen and pelvis was performed following the standard protocol without intravenous contrast.  Comparison: 08/01/2007  Findings: There is mild patchy atelectasis at both lung bases. Pneumonia is not excluded.  No pleural or pericardial fluid.  The liver has a normal appearance without contrast.  There is been previous cholecystectomy.  The spleen is normal.  The pancreas is normal.  The adrenal glands are normal.  The right kidney is atrophic and contains  small nonobstructing calculi.  The left kidney is within normal limits in size and does not show any focal lesions.  No sign of obstruction.  There is marked distention of the stomach and the proximal small intestine consistent with high-grade small bowel obstruction.  The patient appears to have had sub total colectomy.  There is an ostomy in the right abdomen.  There is no evidence of bowel perforation.  No evidence of pelvic mass or adenopathy.  IMPRESSION: High-grade small bowel obstruction, probably due to adhesions. Previous sub total colectomy.  Ostomy in the right abdomen.  Atrophic right kidney containing small nonobstructing calculi.  No gross abnormality of the left kidney.  Original Report Authenticated By: Jules Schick,  M.D.

## 2011-08-05 NOTE — ED Provider Notes (Signed)
History     CSN: [id number]  Arrival date & time 08/05/11  [id number]   First MD Initiated Contact with Patient 08/05/11 1251      Chief Complaint  Patient presents with  . Emesis    (Consider location/radiation/quality/duration/timing/severity/associated sxs/prior treatment) HPI  Past Medical History  Diagnosis Date  . Chronic pancreatitis   . Hyperlipidemia   . Colostomy in place   . Intestinal obstruction   . Alcohol abuse   . Morbid obesity   . Hypertension   . Dialysis care     Past Surgical History  Procedure Date  . Colectomy   . Partial toenail removal  of the right great toe secondary to recurrent infection from ingrown toenail     Family History  Problem Relation Age of Onset  . Hypertension Brother     History  Substance Use Topics  . Smoking status: Never Smoker   . Smokeless tobacco: Not on file  . Alcohol Use: No      Review of Systems  Allergies  Review of patient's allergies indicates no known allergies.  Home Medications   Current Outpatient Rx  Name Route Sig Dispense Refill  . ALLOPURINOL 300 MG PO TABS  TAKE 1 TABLET EVERY DAY FOR GOUT 30 tablet 3  . CLONIDINE HCL 0.2 MG PO TABS Oral Take 0.2 mg by mouth at bedtime.      Marland Kitchen FERROUS SULFATE 325 (65 FE) MG PO TABS Oral Take 325 mg by mouth 2 (two) times daily. Take one tablet by mouth twice a day     . TORSEMIDE 20 MG PO TABS Oral Take 20 mg by mouth daily. Take 2 tablets by mouth once daily     . VALSARTAN-HYDROCHLOROTHIAZIDE 320-25 MG PO TABS Oral Take 1 tablet by mouth daily. 30 tablet 4    BP 172/77  Pulse 90  Temp(Src) 99.5 F (37.5 C) (Oral)  Resp 33  Ht 5\' 6"  (1.676 m)  Wt 328 lb (148.78 kg)  BMI 52.94 kg/m2  SpO2 95%  Physical Exam  ED Course  Procedures (including critical care time)  Labs Reviewed  CBC - Abnormal; Notable for the following:    WBC 12.5 (*)    RDW 15.7 (*)    All other components within normal limits  DIFFERENTIAL - Abnormal; Notable for the  following:    Neutrophils Relative 90 (*)    Neutro Abs 11.2 (*)    Lymphocytes Relative 2 (*)    Lymphs Abs 0.3 (*)    All other components within normal limits  BASIC METABOLIC PANEL - Abnormal; Notable for the following:    Glucose, Bld 149 (*)    BUN 39 (*)    Creatinine, Ser 3.03 (*)    GFR calc non Af Amer 19 (*)    GFR calc Af Amer 23 (*)    All other components within normal limits  LIPASE, BLOOD  HEPATIC FUNCTION PANEL   Ct Abdomen Pelvis Wo Contrast  08/05/2011  *RADIOLOGY REPORT*  Clinical Data: Emesis.  History of pancreatitis.  Abnormal renal function.  CT ABDOMEN AND PELVIS WITHOUT CONTRAST  Technique:  Multidetector CT imaging of the abdomen and pelvis was performed following the standard protocol without intravenous contrast.  Comparison: 08/01/2007  Findings: There is mild patchy atelectasis at both lung bases. Pneumonia is not excluded.  No pleural or pericardial fluid.  The liver has a normal appearance without contrast.  There is been previous cholecystectomy.  The spleen is normal.  The  pancreas is normal.  The adrenal glands are normal.  The right kidney is atrophic and contains  small nonobstructing calculi.  The left kidney is within normal limits in size and does not show any focal lesions.  No sign of obstruction.  There is marked distention of the stomach and the proximal small intestine consistent with high-grade small bowel obstruction.  The patient appears to have had sub total colectomy.  There is an ostomy in the right abdomen.  There is no evidence of bowel perforation.  No evidence of pelvic mass or adenopathy.  IMPRESSION: High-grade small bowel obstruction, probably due to adhesions. Previous sub total colectomy.  Ostomy in the right abdomen.  Atrophic right kidney containing small nonobstructing calculi.  No gross abnormality of the left kidney.  Original Report Authenticated By: Jules Schick, M.D.   [phone] Spoke with Dr. Arnoldo Morale, surgeon. Reviewed presentation         MDM  Progress note placed on original note. Cannot delete this note        [name] Snow. Olin Hauser, MD 08/15/11 2244

## 2011-08-05 NOTE — ED Notes (Signed)
Pt still vomiting. Smells of fecal matter. Dr. Olin Hauser aware. New order to place NGT

## 2011-08-05 NOTE — ED Notes (Signed)
Pt actively vomiting again. Will notify MD.

## 2011-08-05 NOTE — ED Notes (Signed)
Pt c/o nausea, vomiting and diarrhea since yesterday. Pt vomited approximately 300 cc [name] Snow liquid on arrival to ED.

## 2011-08-05 NOTE — ED Provider Notes (Addendum)
History   This chart was scribed for [name] Kung, MD by Malen Gauze. The patient was seen in room APA15/APA15 and the patient's care was started at 1:30PM.    CSN: [id number]  Arrival date & time 08/05/11  0936   First MD Initiated Contact with Patient 08/05/11 1251      Chief Complaint  Patient presents with  . Emesis    (Consider location/radiation/quality/duration/timing/severity/associated sxs/prior treatment) HPI [name] Snow is a 71 y.o. male who presents to the Emergency Department complaining of persistent, moderate to severe n/v/d with an onset yesterday. Yesterday, pt vomited 10+ times and was unsure of the number of times for diarrhea but stool has been runny. Pt's wife states that pt was admited for similar symptoms last yr. No abd pain present. Pt had a colectomy 10 yrs ago with a colostomy bag installed. There has been no blood in colostomy bag (emptied this morning before pt came to the ED) and contents were normal. Hx of pancreatitis. Pt has had a dialysis shunt for a yr but has not started the dialysis process.  Patient with a limited historian old bili level V caveat.  PCP: Dr. Moshe Cipro  Past Medical History  Diagnosis Date  . Chronic pancreatitis   . Hyperlipidemia   . Colostomy in place   . Intestinal obstruction   . Alcohol abuse   . Morbid obesity   . Hypertension   . Dialysis care     Past Surgical History  Procedure Date  . Colectomy   . Partial toenail removal  of the right great toe secondary to recurrent infection from ingrown toenail     Family History  Problem Relation Age of Onset  . Hypertension Brother     History  Substance Use Topics  . Smoking status: Never Smoker   . Smokeless tobacco: Not on file  . Alcohol Use: No      Review of Systems 10 Systems reviewed and are negative for acute change except as noted in the HPI.  Laterality review of systems is limited by the patient's inability to answer most questions  he SO THE ACCURACY OF THE REVIEW OF SYSTEMS IS NOT CLEAR LEVEL V CAVEAT MOST LIKELY APPLies  Allergies  Review of patient's allergies indicates no known allergies.  Home Medications   Current Outpatient Rx  Name Route Sig Dispense Refill  . ALLOPURINOL 300 MG PO TABS  TAKE 1 TABLET EVERY DAY FOR GOUT 30 tablet 3  . CLONIDINE HCL 0.2 MG PO TABS Oral Take 0.2 mg by mouth at bedtime.      Marland Kitchen FERROUS SULFATE 325 (65 FE) MG PO TABS Oral Take 325 mg by mouth 2 (two) times daily. Take one tablet by mouth twice a day     . TORSEMIDE 20 MG PO TABS Oral Take 20 mg by mouth daily. Take 2 tablets by mouth once daily     . VALSARTAN-HYDROCHLOROTHIAZIDE 320-25 MG PO TABS Oral Take 1 tablet by mouth daily. 30 tablet 4    BP 188/98  Pulse 90  Temp(Src) 99.5 F (37.5 C) (Oral)  Resp 29  Ht 5\' 6"  (1.676 m)  Wt 328 lb (148.78 kg)  BMI 52.94 kg/m2  SpO2 95%  Physical Exam  Nursing note and vitals reviewed. Constitutional: He is oriented to person, place, and time. He appears well-developed and well-nourished. No distress.  HENT:  Head: Normocephalic and atraumatic.  Eyes: EOM are normal. Pupils are equal, round, and reactive to light.  Neck:  Neck supple. No tracheal deviation present.  Cardiovascular: Normal rate.   Pulmonary/Chest: Effort normal. No respiratory distress.  Abdominal: Soft. He exhibits no distension.  Musculoskeletal: Normal range of motion. He exhibits no edema.  Neurological: He is alert and oriented to person, place, and time. No sensory deficit.  Skin: Skin is warm and dry.  Psychiatric: He has a normal mood and affect. His behavior is normal.    ED Course  Procedures (including critical care time)  DIAGNOSTIC STUDIES: Oxygen Saturation is 96% on room air, adequate by my interpretation.    COORDINATION OF CARE:     Results for orders placed during the hospital encounter of 08/05/11  CBC      Component Value Range   WBC 12.5 (*) 4.0 - 10.5 (K/uL)   RBC 5.00   4.22 - 5.81 (MIL/uL)   Hemoglobin 13.1  13.0 - 17.0 (g/dL)   HCT 40.4  39.0 - 52.0 (%)   MCV 80.8  78.0 - 100.0 (fL)   MCH 26.2  26.0 - 34.0 (pg)   MCHC 32.4  30.0 - 36.0 (g/dL)   RDW 15.7 (*) 11.5 - 15.5 (%)   Platelets 206  150 - 400 (K/uL)  DIFFERENTIAL      Component Value Range   Neutrophils Relative 90 (*) 43 - 77 (%)   Neutro Abs 11.2 (*) 1.7 - 7.7 (K/uL)   Lymphocytes Relative 2 (*) 12 - 46 (%)   Lymphs Abs 0.3 (*) 0.7 - 4.0 (K/uL)   Monocytes Relative 8  3 - 12 (%)   Monocytes Absolute 1.0  0.1 - 1.0 (K/uL)   Eosinophils Relative 0  0 - 5 (%)   Eosinophils Absolute 0.0  0.0 - 0.7 (K/uL)   Basophils Relative 0  0 - 1 (%)   Basophils Absolute 0.0  0.0 - 0.1 (K/uL)  BASIC METABOLIC PANEL      Component Value Range   Sodium 138  135 - 145 (mEq/L)   Potassium 5.0  3.5 - 5.1 (mEq/L)   Chloride 102  96 - 112 (mEq/L)   CO2 22  19 - 32 (mEq/L)   Glucose, Bld 149 (*) 70 - 99 (mg/dL)   BUN 39 (*) 6 - 23 (mg/dL)   Creatinine, Ser 3.03 (*) 0.50 - 1.35 (mg/dL)   Calcium 10.0  8.4 - 10.5 (mg/dL)   GFR calc non Af Amer 19 (*) >90 (mL/min)   GFR calc Af Amer 23 (*) >90 (mL/min)  LIPASE, BLOOD      Component Value Range   Lipase 55  11 - 59 (U/L)  HEPATIC FUNCTION PANEL      Component Value Range   Total Protein 7.9  6.0 - 8.3 (g/dL)   Albumin 3.8  3.5 - 5.2 (g/dL)   AST 17  0 - 37 (U/L)   ALT 10  0 - 53 (U/L)   Alkaline Phosphatase 55  39 - 117 (U/L)   Total Bilirubin 0.5  0.3 - 1.2 (mg/dL)   Bilirubin, Direct 0.1  0.0 - 0.3 (mg/dL)   Indirect Bilirubin 0.4  0.3 - 0.9 (mg/dL)    No results found.   1. Gastroenteritis       MDM  Patient with known renal insufficiency has not begun dialysis yet but did have shunt placed a year ago, presents with acute onset of vomiting nausea and questionable diarrhea as of yesterday has vomited several times today no blood in the vomit. As noted above patient does have a history  of a colostomy done several years ago for a colon  obstruction. In the emergency department a colostomy bag is not full which is inconsistent with history of significant diarrhea. Abdomen is firm CT abdomen ordered without IV contrast oral contrast. Without IV contrast due to the renal insufficiency and without oral contrast due to persistent vomiting. CT results may dictate surgical intervention therefore did if not should patient most likely needs medical admission for the persistent vomiting. Patient is followed by Dr. Moshe Cipro admission would be by the hospitalist.       I personally performed the services described in this documentation, which was scribed in my presence. The recorded information has been reviewed and considered.         [name] Kung, MD 08/05/11 [place], MD 08/07/11 [phone]

## 2011-08-06 ENCOUNTER — Ambulatory Visit: Payer: Medicare Other | Admitting: Family Medicine

## 2011-08-06 ENCOUNTER — Inpatient Hospital Stay (HOSPITAL_COMMUNITY): Payer: Medicare Other

## 2011-08-06 LAB — BASIC METABOLIC PANEL
BUN: 43 mg/dL — ABNORMAL HIGH (ref 6–23)
Calcium: 9.3 mg/dL (ref 8.4–10.5)
Creatinine, Ser: 3.29 mg/dL — ABNORMAL HIGH (ref 0.50–1.35)
GFR calc Af Amer: 20 mL/min — ABNORMAL LOW (ref >90)
GFR calc non Af Amer: 18 mL/min — ABNORMAL LOW (ref >90)
Glucose, Bld: 137 mg/dL — ABNORMAL HIGH (ref 70–99)
Potassium: 4.7 mEq/L (ref 3.5–5.1)

## 2011-08-06 LAB — CBC
Hemoglobin: 12.3 g/dL — ABNORMAL LOW (ref 13.0–17.0)
MCH: 26.4 pg (ref 26.0–34.0)
MCHC: 32.5 g/dL (ref 30.0–36.0)
RDW: 15.8 % — ABNORMAL HIGH (ref 11.5–15.5)

## 2011-08-06 MED ORDER — SODIUM CHLORIDE 0.9 % IJ SOLN
INTRAMUSCULAR | Status: AC
  Filled 2011-08-06: qty 3

## 2011-08-06 MED ORDER — HYDRALAZINE HCL 20 MG/ML IJ SOLN
10.0000 mg | Freq: Three times a day (TID) | INTRAMUSCULAR | Status: DC | PRN
  Administered 2011-08-08: 10 mg via INTRAVENOUS
  Filled 2011-08-06: qty 1

## 2011-08-06 MED ORDER — NITROGLYCERIN 2 % TD OINT
1.0000 [in_us] | TOPICAL_OINTMENT | Freq: Four times a day (QID) | TRANSDERMAL | Status: DC
Start: 2011-08-06 — End: 2011-08-08
  Administered 2011-08-07 – 2011-08-08 (×7): 1 [in_us] via TOPICAL
  Filled 2011-08-06 (×7): qty 1

## 2011-08-06 NOTE — Progress Notes (Signed)
  Subjective: Minimal abdominal pain. States that the nurse changed his ostomy bag this morning.  Objective: Vital signs in last 24 hours: Temp:  [97.8 F (36.6 C)-99.9 F (37.7 C)] 97.8 F (36.6 C) (01/03 0624) Pulse Rate:  [78-96] 89  (01/03 0624) Resp:  [18-33] 21  (01/03 0624) BP: (158-202)/(67-104) 187/77 mmHg (01/03 0624) SpO2:  [92 %-97 %] 93 % (01/03 0624) Weight:  [148.644 kg (327 lb 11.2 oz)-148.961 kg (328 lb 6.4 oz)] 327 lb 11.2 oz (148.644 kg) (01/03 0624) Last BM Date: 08/05/11  Intake/Output from previous day: 01/02 0701 - 01/03 0700 In: 2196.3 [I.V.:2184.3; IV Piggyback:12] Out: 550 [Emesis/NG output:550] Intake/Output this shift:    General appearance: alert, cooperative, no distress and morbidly obese GI: Soft. No rigidity noted. Stool noted in colostomy bag. Occasional bowel sounds heard. No hernias noted.  Lab Results:   Basename 08/06/11 0503 08/05/11 1024  WBC 9.7 12.5*  HGB 12.3* 13.1  HCT 37.9* 40.4  PLT 205 206   BMET  Basename 08/06/11 0503 08/05/11 1024  NA 140 138  K 4.7 5.0  CL 107 102  CO2 23 22  GLUCOSE 137* 149*  BUN 43* 39*  CREATININE 3.29* 3.03*  CALCIUM 9.3 10.0   PT/INR No results found for this basename: LABPROT:2,INR:2 in the last 72 hours  Studies/Results: Ct Abdomen Pelvis Wo Contrast  08/05/2011  *RADIOLOGY REPORT*  Clinical Data: Emesis.  History of pancreatitis.  Abnormal renal function.  CT ABDOMEN AND PELVIS WITHOUT CONTRAST  Technique:  Multidetector CT imaging of the abdomen and pelvis was performed following the standard protocol without intravenous contrast.  Comparison: 08/01/2007  Findings: There is mild patchy atelectasis at both lung bases. Pneumonia is not excluded.  No pleural or pericardial fluid.  The liver has a normal appearance without contrast.  There is been previous cholecystectomy.  The spleen is normal.  The pancreas is normal.  The adrenal glands are normal.  The right kidney is atrophic and  contains  small nonobstructing calculi.  The left kidney is within normal limits in size and does not show any focal lesions.  No sign of obstruction.  There is marked distention of the stomach and the proximal small intestine consistent with high-grade small bowel obstruction.  The patient appears to have had sub total colectomy.  There is an ostomy in the right abdomen.  There is no evidence of bowel perforation.  No evidence of pelvic mass or adenopathy.  IMPRESSION: High-grade small bowel obstruction, probably due to adhesions. Previous sub total colectomy.  Ostomy in the right abdomen.  Atrophic right kidney containing small nonobstructing calculi.  No gross abnormality of the left kidney.  Original Report Authenticated By: Jules Schick, M.D.    Anti-infectives: Anti-infectives    None      Assessment/Plan: Impression: Small bowel obstruction, no acute surgical intervention warranted at this time. Abdominal films done this morning are of poor quality and difficult to interpret. Plan: Would continue nasogastric tube decompression for now. Will continue to follow with you.  LOS: 1 day    Melani Brisbane A 08/06/2011

## 2011-08-06 NOTE — Progress Notes (Signed)
Subjective: Neuro complaints. Some abdominal discomfort. Tolerating NG tube.  Objective: Weight change:   Intake/Output Summary (Last 24 hours) at 08/06/11 1840 Last data filed at 08/06/11 1332  Gross per 24 hour  Intake 2196.33 ml  Output    950 ml  Net 1246.33 ml   BP 199/77  Pulse 87  Temp(Src) 99.4 F (37.4 C) (Oral)  Resp 22  Ht 5\' 6"  (1.676 m)  Wt 148.644 kg (327 lb 11.2 oz)  BMI 52.89 kg/m2  SpO2 95% General: Alert and oriented x2, no acute distress, fatigued HEENT: Normocephalic, atraumatic, NG tube in place, mucous membranes are dry Vascular: Regular rate and rhythm, Q000111Q, 2/6 systolic ejection murmur Lungs: Decreased breath sounds throughout Abdomen: Soft, no bowel sounds, ostomy tube with brownish liquid in place Extremities: No clubbing or cyanosis, or trace pitting edema bilaterally  Lab Results: Basic Metabolic Panel:  Basename 08/06/11 0503 08/05/11 1024  NA 140 138  K 4.7 5.0  CL 107 102  CO2 23 22  GLUCOSE 137* 149*  BUN 43* 39*  CREATININE 3.29* 3.03*  CALCIUM 9.3 10.0  MG -- --  PHOS -- --   Liver Function Tests:  Basename 08/05/11 1024  AST 17  ALT 10  ALKPHOS 55  BILITOT 0.5  PROT 7.9  ALBUMIN 3.8    Basename 08/05/11 1024  LIPASE 55  AMYLASE --   CBC:  Basename 08/06/11 0503 08/05/11 1024  WBC 9.7 12.5*  NEUTROABS -- 11.2*  HGB 12.3* 13.1  HCT 37.9* 40.4  MCV 81.3 80.8  PLT 205 206    Studies/Results: Ct Abdomen Pelvis Wo Contrast 08/05/2011    IMPRESSION: High-grade small bowel obstruction, probably due to adhesions. Previous sub total colectomy.  Ostomy in the right abdomen.  Atrophic right kidney containing small nonobstructing calculi.  No gross abnormality of the left kidney.    Acute Abdominal Series 08/06/2011    IMPRESSION:  1.  Little change in small bowel obstruction.  NG tube coils in the proximal body of  the stomach. 2.  Diminished aeration with increasing opacity at the left lung base.  Consider atelectasis or  pneumonia.  Recommend follow-up.    Medications: Scheduled Meds:   . cloNIDine  0.2 mg Transdermal Weekly  . metoprolol  5 mg Intravenous Q6H  . nitroGLYCERIN  1 inch Topical Q6H  . pantoprazole (PROTONIX) IV  40 mg Intravenous Q24H  . sodium chloride       Continuous Infusions:   . sodium chloride 20 mL/hr (08/05/11 2110)   PRN Meds:.acetaminophen, acetaminophen, hydrALAZINE, morphine, ondansetron (ZOFRAN) IV, ondansetron, DISCONTD: hydrALAZINE  Assessment/Plan: Patient Active Hospital Problem List: SBO (small bowel obstruction) colon surgery following repeat films in the morning, no change yet.  HYPERLIPIDEMIA : Holding by mouth medications.  OBESITY, MORBID : Stable.  HYPERTENSION (: Increased when necessary hydralazine. Added topical nitro paste.  VENOUS STASIS ULCER, CHRONIC : Stable.  End stage renal disease : Not yet on dialysis. Monitoring renal function.   LOS: 1 day   [name] Snow 08/06/2011, 6:40 PM

## 2011-08-07 ENCOUNTER — Inpatient Hospital Stay (HOSPITAL_COMMUNITY): Payer: Medicare Other

## 2011-08-07 MED ORDER — SODIUM CHLORIDE 0.9 % IJ SOLN
INTRAMUSCULAR | Status: AC
  Filled 2011-08-07: qty 3

## 2011-08-07 MED ORDER — CLONIDINE HCL 0.3 MG/24HR TD PTWK: 0.3000 mg | MEDICATED_PATCH | TRANSDERMAL | Status: DC

## 2011-08-07 NOTE — Progress Notes (Signed)
  Subjective: Not much change, no abdominal pain.  Objective: Vital signs in last 24 hours: Temp:  [98.8 F (37.1 C)-100.1 F (37.8 C)] 98.8 F (37.1 C) Aug 13, 2022 1608) Pulse Rate:  [86-97] 88  Aug 13, 2022 1608) Resp:  [20-24] 24  08/13/2022 1608) BP: (157-198)/(67-71) 198/67 mmHg 2022/08/13 1608) SpO2:  [91 %-94 %] 92 % Aug 13, 2022 1608) Weight:  [146.92 kg (323 lb 14.4 oz)] 323 lb 14.4 oz (146.92 kg) Aug 13, 2022 0522) Last BM Date: 08/14/2011  Intake/Output from previous day: 01/03 0701 - 2022/08/13 0700 In: 333 [I.V.:323; IV Piggyback:10] Out: 1625 [Urine:775; Stool:850] Intake/Output this shift: Total I/O In: -  Out: 900 [Urine:900]  Gen: Fatigued, alert and oriented, no acute distress Cardiovascular: Regular rate and rhythm, Q000111Q, soft 2/6 systolic ejection murmur Lungs: Limited inspiration, otherwise to auscultation bilaterally Abdomen: Obese, nontender, no bowel sounds, possibly bag with dark fluid. Unchanged. Extremities: No clubbing or cyanosis or edema.  Lab Results:   Basename 08/06/11 0503 08/05/11 1024  WBC 9.7 12.5*  HGB 12.3* 13.1  HCT 37.9* 40.4  PLT 205 206   BMET  Basename 08/06/11 0503 08/05/11 1024  NA 140 138  K 4.7 5.0  CL 107 102  CO2 23 22  GLUCOSE 137* 149*  BUN 43* 39*  CREATININE 3.29* 3.03*  CALCIUM 9.3 10.0   PT/INR No results found for this basename: LABPROT:2,INR:2 in the last 72 hours  Studies/Results: Dg Abd 2 Views: August 14, 2011   IMPRESSION: Suboptimal visualization of bowel gas pattern due to motion, positioning, and body habitus. Persistent dilatation of bowel loops is identified.    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 Appreciate surgery followup. Continue NG tube. Recheck films in the morning. HYPERLIPIDEMIA : Holding by mouth medications.   OBESITY, MORBID : Stable.   HYPERTENSION: Pressure still high. Increase clonidine patch to 0.3 mg topically.    VENOUS STASIS ULCER, CHRONIC : Stable.   End stage renal disease : Not yet on dialysis. Monitoring renal function.    LOS: 2 days    Annita Brod Aug 14, 2011

## 2011-08-07 NOTE — Progress Notes (Signed)
NG Tube was removed approximately 6" by the Patient several times early in this shift.  Pt is on Intermittent suctioning and there has been 50cc of drainage in 24 hours.  Pt has colostomy and stool is presently noted in the colostomy.  Pt denies Abdominal pain or N/V.  Dr. Arnoldo Morale was informed and gave a verbal order to discontinue the NG tube.  NG tube removed without complications.  Patient remains NPO with ice chips and is tolerating well.  Patient tolerated procedure well and has no further complaints.

## 2011-08-07 NOTE — Progress Notes (Signed)
Subjective: No new complaints. No significant abdominal pain.  Objective: Vital signs in last 24 hours: Temp:  [99.3 F (37.4 C)-100.1 F (37.8 C)] 100.1 F (37.8 C) (01/04 0522) Pulse Rate:  [86-97] 86  (01/04 0522) Resp:  [20-22] 20  (01/04 0522) BP: (157-199)/(67-77) 165/67 mmHg (01/04 0522) SpO2:  [91 %-95 %] 94 % (01/04 0522) Weight:  [146.92 kg (323 lb 14.4 oz)] 323 lb 14.4 oz (146.92 kg) (01/04 0522) Last BM Date: 08/07/11  Intake/Output from previous day: 01/03 0701 - 01/04 0700 In: 333 [I.V.:323; IV Piggyback:10] Out: 1625 [Urine:775; Stool:850] Intake/Output this shift: Total I/O In: -  Out: 350 [Urine:350]  GI: Soft. Ileostomy with fluid in the bag. Occasional bowel sounds heard. No rigidity noted.  Lab Results:   Basename 08/06/11 0503 08/05/11 1024  WBC 9.7 12.5*  HGB 12.3* 13.1  HCT 37.9* 40.4  PLT 205 206   BMET  Basename 08/06/11 0503 08/05/11 1024  NA 140 138  K 4.7 5.0  CL 107 102  CO2 23 22  GLUCOSE 137* 149*  BUN 43* 39*  CREATININE 3.29* 3.03*  CALCIUM 9.3 10.0   PT/INR No results found for this basename: LABPROT:2,INR:2 in the last 72 hours  Studies/Results: Ct Abdomen Pelvis Wo Contrast  08/05/2011  *RADIOLOGY REPORT*  Clinical Data: Emesis.  History of pancreatitis.  Abnormal renal function.  CT ABDOMEN AND PELVIS WITHOUT CONTRAST  Technique:  Multidetector CT imaging of the abdomen and pelvis was performed following the standard protocol without intravenous contrast.  Comparison: 08/01/2007  Findings: There is mild patchy atelectasis at both lung bases. Pneumonia is not excluded.  No pleural or pericardial fluid.  The liver has a normal appearance without contrast.  There is been previous cholecystectomy.  The spleen is normal.  The pancreas is normal.  The adrenal glands are normal.  The right kidney is atrophic and contains  small nonobstructing calculi.  The left kidney is within normal limits in size and does not show any focal  lesions.  No sign of obstruction.  There is marked distention of the stomach and the proximal small intestine consistent with high-grade small bowel obstruction.  The patient appears to have had sub total colectomy.  There is an ostomy in the right abdomen.  There is no evidence of bowel perforation.  No evidence of pelvic mass or adenopathy.  IMPRESSION: High-grade small bowel obstruction, probably due to adhesions. Previous sub total colectomy.  Ostomy in the right abdomen.  Atrophic right kidney containing small nonobstructing calculi.  No gross abnormality of the left kidney.  Original Report Authenticated By: Jules Schick, M.D.   Acute Abdominal Series  08/06/2011  *RADIOLOGY REPORT*  Clinical Data: Follow up high-grade small bowel obstruction  ACUTE ABDOMEN SERIES (ABDOMEN 2 VIEW & CHEST 1 VIEW)  Comparison: Chest x-ray of 09/15/2010 and CT abdomen pelvis of 08/05/2011  Findings: The lungs are poorly aerated with basilar atelectasis left greater than right.  Pneumonia at the left lung base cannot be excluded.  Cardiomegaly is stable.  An NG tube is noted within the stomach.  There is little change in significant gaseous distention of small bowel loops consistent with small bowel obstruction.  An NG tube is noted coiling within the fundus and proximal body of the stomach. No free air is seen on the erect view.  IMPRESSION:  1.  Little change in small bowel obstruction.  NG tube coils in the proximal body of  the stomach. 2.  Diminished aeration with increasing  opacity at the left lung base.  Consider atelectasis or pneumonia.  Recommend follow-up.  Original Report Authenticated By: Joretta Bachelor, M.D.    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 Patient is at high risk for surgical intervention, so I would like to continue nasogastric tube decompression for now. Hopefully this will resolve the patient's obstruction as it has in the past. 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 This subsequently had to be converted to an ileostomy do to poor intestinal motility.   LOS: 2 days    Loreena Valeri A 08/07/2011

## 2011-08-07 NOTE — Progress Notes (Signed)
UR Chart Review Completed  

## 2011-08-08 LAB — CBC
Hemoglobin: 11.2 g/dL — ABNORMAL LOW (ref 13.0–17.0)
MCHC: 31.2 g/dL (ref 30.0–36.0)
RDW: 15.5 % (ref 11.5–15.5)

## 2011-08-08 LAB — BASIC METABOLIC PANEL
BUN: 49 mg/dL — ABNORMAL HIGH (ref 6–23)
Creatinine, Ser: 3.51 mg/dL — ABNORMAL HIGH (ref 0.50–1.35)
GFR calc Af Amer: 19 mL/min — ABNORMAL LOW (ref >90)
GFR calc non Af Amer: 16 mL/min — ABNORMAL LOW (ref >90)
Potassium: 3.7 mEq/L (ref 3.5–5.1)

## 2011-08-08 MED ORDER — MORPHINE SULFATE 2 MG/ML IJ SOLN
2.0000 mg | Freq: Once | INTRAMUSCULAR | Status: AC
  Administered 2011-08-08: 2 mg via INTRAVENOUS

## 2011-08-08 MED ORDER — LEVALBUTEROL HCL 0.63 MG/3ML IN NEBU: 0.6300 mg | INHALATION_SOLUTION | Freq: Four times a day (QID) | RESPIRATORY_TRACT | Status: DC | PRN

## 2011-08-08 MED ORDER — TORSEMIDE 20 MG PO TABS
20.0000 mg | ORAL_TABLET | Freq: Every day | ORAL | Status: DC
  Administered 2011-08-08 – 2011-08-09 (×2): 20 mg via ORAL
  Filled 2011-08-08: qty 1
  Filled 2011-08-08: qty 3

## 2011-08-08 MED ORDER — OLMESARTAN MEDOXOMIL 20 MG PO TABS
40.0000 mg | ORAL_TABLET | Freq: Every day | ORAL | Status: DC
  Administered 2011-08-08 – 2011-08-11 (×4): 40 mg via ORAL
  Filled 2011-08-08 (×4): qty 2

## 2011-08-08 MED ORDER — FERROUS SULFATE 325 (65 FE) MG PO TABS
325.0000 mg | ORAL_TABLET | Freq: Two times a day (BID) | ORAL | Status: DC
  Administered 2011-08-08 – 2011-08-13 (×11): 325 mg via ORAL
  Filled 2011-08-08 (×11): qty 1

## 2011-08-08 MED ORDER — CLONIDINE HCL 0.2 MG PO TABS
0.2000 mg | ORAL_TABLET | Freq: Every day | ORAL | Status: DC
  Administered 2011-08-08 – 2011-08-12 (×5): 0.2 mg via ORAL
  Filled 2011-08-08 (×5): qty 1

## 2011-08-08 MED ORDER — ALLOPURINOL 300 MG PO TABS
300.0000 mg | ORAL_TABLET | Freq: Every day | ORAL | Status: DC
  Administered 2011-08-08 – 2011-08-09 (×2): 300 mg via ORAL
  Filled 2011-08-08 (×2): qty 1

## 2011-08-08 MED ORDER — HYDRALAZINE HCL 20 MG/ML IJ SOLN
INTRAMUSCULAR | Status: AC
  Filled 2011-08-08: qty 2

## 2011-08-08 MED ORDER — HYDRALAZINE HCL 20 MG/ML IJ SOLN
25.0000 mg | Freq: Once | INTRAMUSCULAR | Status: AC
  Administered 2011-08-08: 25 mg via INTRAVENOUS

## 2011-08-08 MED ORDER — HYDROCHLOROTHIAZIDE 25 MG PO TABS
25.0000 mg | ORAL_TABLET | Freq: Every day | ORAL | Status: DC
  Administered 2011-08-08 – 2011-08-09 (×2): 25 mg via ORAL
  Filled 2011-08-08 (×2): qty 1

## 2011-08-08 NOTE — Progress Notes (Signed)
  Subjective: Patient doing better. Small bowel structure looks to be resolved. NG tube discontinued. Patient's wife noted that he appeared to have some episodes of labored breathing and wheezing. Patient suffered no complaints.  Objective: Vital signs in last 24 hours: Temp:  [98.1 F (36.7 C)-99.9 F (37.7 C)] 99.9 F (37.7 C) (01/05 1345) Pulse Rate:  [80-89] 88  (01/05 1345) Resp:  [20-24] 21  (01/05 1345) BP: (117-214)/(67-85) 117/76 mmHg (01/05 1345) SpO2:  [92 %-100 %] 96 % (01/05 1345) Last BM Date: 08/08/11  Intake/Output from previous day: 01/04 0701 - 01/05 0700 In: 1495 [P.O.:360; IV Piggyback:1135] Out: 3050 [Urine:1500; Stool:1550] Intake/Output this shift: Total I/O In: 272 [I.V.:272] Out: 300 [Stool:300]  Gen: Fatigued, alert and oriented, no acute distress Cardiovascular: Regular rate and rhythm, Q000111Q, soft 2/6 systolic ejection murmur Lungs: Limited inspiration, mostly clear with occasional end expiratory prolongation Abdomen: Obese, nontender, some bowel sounds, possibly bag with dark fluid. Unchanged. Extremities: No clubbing or cyanosis or edema.  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 Looks to be resolved. Appreciate surgery followup. Diet restarted. If doing well, can be discharged home tomorrow.  Wheezing: Check CBC for question infection. Patient otherwise looks well. Noted normal O2 saturations. Will give breathing treatments.  HYPERLIPIDEMIA : Resuming by mouth meds  OBESITY, MORBID : Stable.   HYPERTENSION: Discontinuing nitro paste and IV Lopressor. Resuming all by mouth meds plus when necessary  hydralazine.  VENOUS STASIS ULCER, CHRONIC : Stable.   End stage renal disease : Not yet on dialysis. Monitoring renal function.    LOS: 3 days    Annita Brod 08/08/2011

## 2011-08-08 NOTE — Progress Notes (Signed)
  Subjective: Feels much better. No nausea or vomiting.  Objective: Vital signs in last 24 hours: Temp:  [98.1 F (36.7 C)-99.1 F (37.3 C)] 98.1 F (36.7 C) (01/05 0604) Pulse Rate:  [80-89] 89  (01/05 0700) Resp:  [20-24] 20  (01/05 0604) BP: (172-214)/(67-85) 172/84 mmHg (01/05 0700) SpO2:  [92 %-98 %] 98 % (01/05 0604) Last BM Date: 29-Aug-2011  Intake/Output from previous day: 08/28/2022 0701 - 01/05 0700 In: 1495 [P.O.:360; IV Piggyback:1135] Out: 3050 [Urine:1500; Stool:1550] Intake/Output this shift: Total I/O In: 272 [I.V.:272] Out: -   General appearance: alert and cooperative GI: Soft, nontender. Ileostomy bag full with gas and fluid.  Lab Results:   Basename 08/06/11 0503 08/05/11 1024  WBC 9.7 12.5*  HGB 12.3* 13.1  HCT 37.9* 40.4  PLT 205 206   BMET  Basename 08/08/11 0735 08/06/11 0503  NA 138 140  K 3.7 4.7  CL 108 107  CO2 20 23  GLUCOSE 92 137*  BUN 49* 43*  CREATININE 3.51* 3.29*  CALCIUM 9.3 9.3   PT/INR No results found for this basename: LABPROT:2,INR:2 in the last 72 hours  Studies/Results: Dg Abd 2 Views  August 29, 2011  *RADIOLOGY REPORT*  Clinical Data: Small bowel obstruction, follow-up  ABDOMEN - 2 VIEW  Comparison: 08/06/2011  Findings: Nasogastric tube in stomach. Persistent dilatation of bowel loops in the abdomen though bowel gas pattern is suboptimally assessed due to patient body habitus and motion. No gross evidence of free intraperitoneal air though exam cannot exclude free air. Bones appear demineralized.  IMPRESSION: Suboptimal visualization of bowel gas pattern due to motion, positioning, and body habitus. Persistent dilatation of bowel loops is identified.  Original Report Authenticated By: Burnetta Sabin, M.D.    Anti-infectives: Anti-infectives    None      Assessment/Plan: Impression: Small bowel obstruction/ileus, resolved Plan: No need for surgical intervention. We'll advance to regular diet.  LOS: 3 days     [name] Snow A 08/08/2011

## 2011-08-08 NOTE — Plan of Care (Signed)
Problem: Phase I Progression Outcomes Goal: OOB as tolerated unless otherwise ordered Outcome: Progressing OOB x2 hrs

## 2011-08-08 NOTE — Progress Notes (Signed)
Changed waffer of the colostomy due to leakage.

## 2011-08-09 ENCOUNTER — Inpatient Hospital Stay (HOSPITAL_COMMUNITY): Payer: Medicare Other

## 2011-08-09 LAB — GLUCOSE, CAPILLARY: Glucose-Capillary: 120 mg/dL — ABNORMAL HIGH (ref 70–99)

## 2011-08-09 MED ORDER — MORPHINE SULFATE 2 MG/ML IJ SOLN
1.0000 mg | INTRAMUSCULAR | Status: DC
  Administered 2011-08-09 – 2011-08-11 (×11): 1 mg via INTRAVENOUS
  Filled 2011-08-09 (×9): qty 1

## 2011-08-09 MED ORDER — PANTOPRAZOLE SODIUM 40 MG PO TBEC: 40.0000 mg | DELAYED_RELEASE_TABLET | Freq: Every day | ORAL | Status: DC

## 2011-08-09 MED ORDER — PANTOPRAZOLE SODIUM 40 MG IV SOLR
40.0000 mg | INTRAVENOUS | Status: DC
  Administered 2011-08-09 – 2011-08-12 (×4): 40 mg via INTRAVENOUS
  Filled 2011-08-09 (×4): qty 40

## 2011-08-09 MED ORDER — SODIUM CHLORIDE 0.9 % IV SOLN: INTRAVENOUS | Status: DC

## 2011-08-09 MED ORDER — METOPROLOL TARTRATE 1 MG/ML IV SOLN
5.0000 mg | Freq: Four times a day (QID) | INTRAVENOUS | Status: DC
  Administered 2011-08-09 – 2011-08-11 (×8): 5 mg via INTRAVENOUS
  Filled 2011-08-09 (×8): qty 5

## 2011-08-09 NOTE — Progress Notes (Signed)
  Subjective: Patient looked to be having some increased work of breathing today. Nursing also noted that he looked to be having abdominal pain but does not voice does complain very much unless prompted. He looked really be uncomfortable. Chest x-ray ordered which only noted atelectasis and no signs of pneumonia, but signs of persistent on x-ray of continued dilated loops of bowel. In examination, patient did look to have some abdominal pain.  Objective: Vital signs in last 24 hours: Temp:  [97.5 F (36.4 C)-99.2 F (37.3 C)] 99.1 F (37.3 C) (01/06 1344) Pulse Rate:  [74-89] 89  (01/06 1344) Resp:  [20-21] 21  (01/06 1344) BP: (169-179)/(71-88) 179/74 mmHg (01/06 1344) SpO2:  [95 %-96 %] 96 % (01/06 1344) Last BM Date: 08/09/11  Intake/Output from previous day: 01/05 0701 - 01/06 0700 In: 741.3 [P.O.:240; I.V.:501.3] Out: 1375 [Urine:475; Stool:900] Intake/Output this shift:   Gen: Fatigued, alert and oriented, mild distress from abdominal pain Cardiovascular: Regular rate and rhythm, Q000111Q, soft 2/6 systolic ejection murmur Lungs: Limited inspiration, mostly clear with occasional end expiratory prolongation Abdomen: Obese, nontender, some bowel sounds, possibly bag with dark fluid. Unchanged. Extremities: No clubbing or cyanosis or edema.  Lab Results:   [facility] 08/08/11 0735  WBC 6.6  HGB 11.2*  HCT 35.9*  PLT 190   BMET  Basename 08/08/11 0735  NA 138  K 3.7  CL 108  CO2 20  GLUCOSE 92  BUN 49*  CREATININE 3.51*  CALCIUM 9.3    Assessment/Plan: Small bowel obstruction versus intestinal atony. Initially thought to be resolved. Now looks like it has returned. We'll make n.p.o. and given his limited ability to communicate with the 90s and discomfort, we'll try some scheduled morphine every 3 hours at a very low dose. Recheck abdominal films in the morning. If films show persistent bowel obstruction, will consult Gen. surgery, Dr. Arnoldo Morale to re\re see  again.  Wheezing: Looks to be more labored breathing secondary to atelectasis  HYPERLIPIDEMIA : Resuming by mouth meds  OBESITY, MORBID : Stable.   HYPERTENSION: Continue IV Lopressor.Marland Kitchen Resuming all by mouth meds plus when necessary hydralazine.  VENOUS STASIS ULCER, CHRONIC : Stable.   End stage renal disease : Not yet on dialysis. Monitoring renal function. Recheck creatinine in the morning    LOS: 4 days    Gevena Barre K 08/09/2011

## 2011-08-09 NOTE — Progress Notes (Signed)
Went into room to assess patient. Patient was visible in pain with tears but very stoic. Denied pain at first but finally did allow me to give him his PRN morphine at 1944. Instructed patient to call if pain does not subside and not to eat solid foods at this time. When asked when the pain started, patient stated before having his meal at suppertime. Went back to check on patient and rate his pain. Patient stated "its about the same". Asked if he needed more pain medication. Dr. Shanon Brow notified. A one time extra dose of Morphine 2 mg ordered. Patient has been resting since. Will continue to monitor.

## 2011-08-09 NOTE — Progress Notes (Signed)
Discussed the patient xray with the MD.  Notified him that the patient had one bout of nausea without vomiting. And also c/o of abdominal pain.  New orders given.

## 2011-08-09 NOTE — Plan of Care (Signed)
Problem: Phase I Progression Outcomes Goal: Pain controlled with appropriate interventions Outcome: Progressing Pt complaining of increased pain in the right upper quadrant.

## 2011-08-09 NOTE — Progress Notes (Signed)
The patient is receiving Protonix by the intravenous route.  Based on criteria approved by the Pharmacy and [place], the medication is being converted to the equivalent oral dose form.  These criteria include: -No Active GI bleeding -Able to tolerate diet of full liquids (or better) or tube feeding -Able to tolerate other medications by the oral or enteral route  If you have any questions about this conversion, please contact the Pharmacy Department (ext [phone]).  Thank you.  Juel Burrow, PharmD

## 2011-08-09 NOTE — Progress Notes (Signed)
  Subjective: Is tolerating regular diet. He denies any nausea or vomiting. His primary complaint is shortness of breath.  Objective: Vital signs in last 24 hours: Temp:  [97.5 F (36.4 C)-99.9 F (37.7 C)] 97.5 F (36.4 C) (01/06 0512) Pulse Rate:  [74-88] 74  (01/06 0512) Resp:  [20-21] 20  (01/06 0512) BP: (117-174)/(68-88) 173/71 mmHg (01/06 0512) SpO2:  [95 %-100 %] 96 % (01/06 0512) Last BM Date: 08/08/11  Intake/Output from previous day: 01/05 0701 - 01/06 0700 In: 741.3 [P.O.:240; I.V.:501.3] Out: 1375 [Urine:475; Stool:900] Intake/Output this shift:    GI: Soft. Non-tender. No rigidity noted. Ileostomy working.  Lab Results:   [facility] 08/08/11 0735  WBC 6.6  HGB 11.2*  HCT 35.9*  PLT 190   BMET  Basename 08/08/11 0735  NA 138  K 3.7  CL 108  CO2 20  GLUCOSE 92  BUN 49*  CREATININE 3.51*  CALCIUM 9.3   PT/INR No results found for this basename: LABPROT:2,INR:2 in the last 72 hours  Studies/Results: Dg Abd 2 Views  09-Aug-2011  *RADIOLOGY REPORT*  Clinical Data: Small bowel obstruction, follow-up  ABDOMEN - 2 VIEW  Comparison: 08/06/2011  Findings: Nasogastric tube in stomach. Persistent dilatation of bowel loops in the abdomen though bowel gas pattern is suboptimally assessed due to patient body habitus and motion. No gross evidence of free intraperitoneal air though exam cannot exclude free air. Bones appear demineralized.  IMPRESSION: Suboptimal visualization of bowel gas pattern due to motion, positioning, and body habitus. Persistent dilatation of bowel loops is identified.  Original Report Authenticated By: Burnetta Sabin, M.D.    Anti-infectives: Anti-infectives    None      Assessment/Plan:  Impression: Small bowel obstruction/ileus, resolved. No need for surgical intervention. Plan: As ileus has resolved, we'll sign off for now. Please call me if I can be of further assistance.  LOS: 4 days    Meridian Scherger A 08/09/2011

## 2011-08-10 ENCOUNTER — Encounter (HOSPITAL_COMMUNITY): Payer: Self-pay | Admitting: Internal Medicine

## 2011-08-10 DIAGNOSIS — J9811 Atelectasis: Secondary | ICD-10-CM | POA: Diagnosis not present

## 2011-08-10 LAB — BASIC METABOLIC PANEL
BUN: 47 mg/dL — ABNORMAL HIGH (ref 6–23)
Calcium: 9.4 mg/dL (ref 8.4–10.5)
GFR calc non Af Amer: 15 mL/min — ABNORMAL LOW (ref >90)
Glucose, Bld: 114 mg/dL — ABNORMAL HIGH (ref 70–99)

## 2011-08-10 MED ORDER — SODIUM CHLORIDE 0.9 % IJ SOLN
INTRAMUSCULAR | Status: AC
  Administered 2011-08-10: 21:00:00
  Filled 2011-08-10: qty 3

## 2011-08-10 MED ORDER — ERYTHROMYCIN BASE 333 MG PO TBEC
333.0000 mg | DELAYED_RELEASE_TABLET | Freq: Three times a day (TID) | ORAL | Status: DC
  Administered 2011-08-10 – 2011-08-13 (×11): 333 mg via ORAL
  Filled 2011-08-10 (×16): qty 1

## 2011-08-10 NOTE — Progress Notes (Signed)
  Subjective: The patient finished approximately 65% of his lunch meal. He is complaining of epigastric abdominal discomfort. He denies nausea and vomiting.    Objective: Vital signs in last 24 hours: Temp:  [97.6 F (36.4 C)-97.8 F (36.6 C)] 97.8 F (36.6 C) (01/07 0452) Pulse Rate:  [86-93] 86  (01/07 0452) Resp:  [20] 20  (01/07 0452) BP: (138-164)/(71-85) 163/73 mmHg (01/07 0452) SpO2:  [95 %-96 %] 95 % (01/07 0452) Last BM Date: 08/09/11  Intake/Output from previous day: 01/06 0701 - 01/07 0700 In: 721 [P.O.:240; I.V.:479; IV Piggyback:2] Out: 2250 [Urine:1400; Stool:850] Intake/Output this shift: Total I/O In: -  Out: 525 [Urine:175; Stool:350] General: Morbidly obese 71 year old after American man sitting up in bed, in no acute distress. Cardiovascular: Regular rate and rhythm, Q000111Q, soft 2/6 systolic ejection murmur Lungs: Clear anteriorly and decreased breath sounds in the bases. Abdomen: Morbidly obese, mild to moderate epigastric tenderness, mild distention, liquidy watery brown stool in the ostomy bag. Extremities: Trace of pedal edema. Chronic venous stasis changes bilaterally.  Lab Results:   [facility] 08/08/11 0735  WBC 6.6  HGB 11.2*  HCT 35.9*  PLT 190   BMET  Basename 08/10/11 0523 08/08/11 0735  NA 134* 138  K 3.9 3.7  CL 104 108  CO2 20 20  GLUCOSE 114* 92  BUN 47* 49*  CREATININE 3.81* 3.51*  CALCIUM 9.4 9.3    Assessment/Plan: Small bowel obstruction versus intestinal atony versus both. The patient's abdominal x-ray on 08/08/2010 revealed worsening small bowel distention. Dr. Arnoldo Morale' assessment and recommendations are noted. He has advanced the patient's diet and started erythromycin. The patient continues to have some upper abdominal discomfort. He has a watery brown stool in his ostomy bag.   Wheezing/atelectasis. He is on when necessary Xopenex. Will add incentive spirometry. Also order a PT consult and try to mobilize the  patient.  HYPERLIPIDEMIA :   OBESITY, MORBID : Stable.   HYPERTENSION: Continue IV Lopressor.Marland Kitchen Resuming all by mouth meds plus when necessary hydralazine.  VENOUS STASIS ULCER, CHRONIC : Stable.   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 His creatinine has been more or less stable.  Chronic iron deficiency anemia. Stable on ferrous sulfate.    LOS: 5 days    Euphemia Lingerfelt 08/10/2011

## 2011-08-10 NOTE — Progress Notes (Signed)
Physical Therapy Evaluation Patient Details Name: [name] Snow MRN: [id number] DOB: Jul 13, 1941 Today's Date: 08/10/2011  Problem List:  Patient Active Problem List  Diagnoses  . HYPERLIPIDEMIA  . OBESITY, MORBID  . HYPERTENSION  . VENOUS STASIS ULCER, CHRONIC  . ALLERGIC RHINITIS CAUSE UNSPECIFIED  . INTESTINAL OBSTRUCTION  . PANCREATITIS, CHRONIC  . CELLULITIS  . FATIGUE  . HYPERURICEMIA  . OTH&UNSPEC SUPERFICIAL INJURY FACE NECK&SCLP INF  . ALCOHOL ABUSE, HX OF  . End stage renal disease  . Anemia  . SBO (small bowel obstruction)  . Atelectasis    Past Medical History:  Past Medical History  Diagnosis Date  . Chronic pancreatitis   . Hyperlipidemia   . Colostomy in place   . Intestinal obstruction   . Alcohol abuse   . Morbid obesity   . Hypertension   . Dialysis care    Past Surgical History:  Past Surgical History  Procedure Date  . Colectomy   . Partial toenail removal  of the right great toe secondary to recurrent infection from ingrown toenail     PT Assessment/Plan/Recommendation PT Assessment Clinical Impression Statement: morbidly obese pt, very cooperative, extremely deconditioned...only able to ambulate 3' from bed to chair...recommend SNF at d/c and pt is agreeable PT Recommendation/Assessment: Patient will need skilled PT in the acute care venue PT Problem List: Decreased strength;Decreased activity tolerance;Decreased mobility;Decreased safety awareness;Pain;Obesity Barriers to Discharge: Decreased caregiver support PT Therapy Diagnosis : Difficulty walking;Generalized weakness;Acute pain PT Plan PT Frequency: Min 3X/week PT Treatment/Interventions: Gait training;Stair training;Therapeutic exercise;Patient/family education PT Recommendation Follow Up Recommendations: Skilled nursing facility Equipment Recommended: Defer to next venue PT Goals  Acute Rehab PT Goals PT Goal Formulation: With patient/family Time For Goal Achievement: 2  weeks Pt will go Supine/Side to Sit: with min assist Pt will go Sit to Supine/Side: with min assist Pt will go Sit to Stand: with supervision Pt will go Stand to Sit: with supervision Pt will Ambulate: 1 - 15 feet;with min assist;with rolling walker  PT Evaluation Precautions/Restrictions  Precautions Precautions: Fall Required Braces or Orthoses: No Restrictions Weight Bearing Restrictions: No Other Position/Activity Restrictions: has colostomy Prior Functioning  Home Living Lives With: Alone Receives Help From: Family Type of Home: Apartment Home Layout: One level Home Access: Level entry Home Adaptive Equipment: Walker - rolling;Straight cane Prior Function Level of Independence: Independent with basic ADLs;Independent with gait;Requires assistive device for independence;Independent with transfers;Needs assistance with homemaking Driving: No Vocation: Retired Artist: Awake/alert Overall Cognitive Status: Appears within functional limits for tasks assessed Sensation/Coordination Sensation Light Touch: Not tested Stereognosis: Not tested Hot/Cold: Not tested Proprioception: Not tested Extremity Assessment RUE Assessment RUE Assessment: Within Functional Limits LUE Assessment LUE Assessment: Within Functional Limits RLE Assessment RLE Assessment: Exceptions to [facility] RLE Strength RLE Overall Strength Comments: strength generally 3-/5 LLE Assessment LLE Assessment: Exceptions to [facility] LLE Strength LLE Overall Strength Comments: strength generally 3-/5 Mobility (including Balance) Bed Mobility Bed Mobility: Yes Right Sidelying to Sit: 3: Mod assist Sitting - Scoot to Edge of Bed: 5: Supervision Transfers Transfers: Yes Sit to Stand: 4: Min assist Stand to Sit: 4: Min assist Ambulation/Gait Ambulation/Gait: Yes Ambulation/Gait Assistance: 3: Mod assist Ambulation Distance (Feet): 3 Feet Assistive device: Rolling walker (needs  bariatric walker) Gait Pattern: Within Functional Limits (wide based gait) Gait velocity: very slow and labored Stairs: No Wheelchair Mobility Wheelchair Mobility: No  Posture/Postural Control Posture/Postural Control: No significant limitations Balance Balance Assessed: No Exercise    End of Session PT -  End of Session Equipment Utilized During Treatment: Gait belt Activity Tolerance: Patient tolerated treatment well Patient left: in chair;with call bell in reach;with family/visitor present Nurse Communication: Mobility status for transfers;Mobility status for ambulation General Behavior During Session: [facility] for tasks performed Cognition: [facility] for tasks performed  Sable Feil 08/10/2011, 5:02 PM

## 2011-08-10 NOTE — Progress Notes (Signed)
Patient up to chair with nurse and PT assist.

## 2011-08-10 NOTE — Progress Notes (Signed)
  Subjective: Patient states he has had some upper abdominal pain with eating. He does feel better this morning. He continues to have significant ileostomy output. He states he somewhat feels back to his baseline, though he was short of breath yesterday. This has improved today.  Objective: Vital signs in last 24 hours: Temp:  [97.6 F (36.4 C)-99.1 F (37.3 C)] 97.8 F (36.6 C) (01/07 0452) Pulse Rate:  [86-93] 86  (01/07 0452) Resp:  [20-21] 20  (01/07 0452) BP: (138-179)/(71-85) 163/73 mmHg (01/07 0452) SpO2:  [95 %-96 %] 95 % (01/07 0452) Last BM Date: 08/09/11  Intake/Output from previous day: 01/06 0701 - 01/07 0700 In: 721 [P.O.:240; I.V.:479; IV Piggyback:2] Out: 2250 [Urine:1400; Stool:850] Intake/Output this shift: Total I/O In: -  Out: 525 [Urine:175; Stool:350]  GI: Soft, mildly distended. No rigidity noted. Ileostomy patent with fluid and gas present.  Lab Results:   [facility] 08/08/11 0735  WBC 6.6  HGB 11.2*  HCT 35.9*  PLT 190   BMET  Basename 08/10/11 0523 08/08/11 0735  NA 134* 138  K 3.9 3.7  CL 104 108  CO2 20 20  GLUCOSE 114* 92  BUN 47* 49*  CREATININE 3.81* 3.51*  CALCIUM 9.4 9.3   PT/INR No results found for this basename: LABPROT:2,INR:2 in the last 72 hours  Studies/Results: Dg Chest Port 1 View  08/09/2011  *RADIOLOGY REPORT*  Clinical Data: Shortness of breath  PORTABLE CHEST - 1 VIEW  Comparison: 08/05/2010  Findings: Mild patchy left lower lobe opacity, likely atelectasis. No pleural effusion or pneumothorax.  Stable mild cardiomegaly.  Again seen are dilated loops of small bowel in the upper abdomen.  IMPRESSION: Suspected left lower lobe atelectasis.  Mild cardiomegaly.  Original Report Authenticated By: Julian Hy, M.D.   Dg Abd Portable 2v  08/09/2011  *RADIOLOGY REPORT*  Clinical Data: Follow up small bowel obstruction.  PORTABLE ABDOMEN - 2 VIEW 08/09/2011 2033 hours:  Comparison: Two-view abdomen x-ray 08/07/2011, acute  abdomen series 08/06/2011, CT abdomen and pelvis 08/05/2011.  Findings: Interval worsening of the small bowel distention since the examination 2 days ago.  Gas is present within decompressed ascending colon.  Nasogastric tube has been removed in the interval.  Scattered air-fluid levels on the erect image without evidence of free intraperitoneal air.  IMPRESSION: Interval worsening of small bowel distention since the examination 2 days ago.  No free intraperitoneal air.  Original Report Authenticated By: Deniece Portela, M.D.    Anti-infectives: Anti-infectives    None      Assessment/Plan: Impression: Intestinal atony. I do realize that he may have a component of a partial small bowel obstruction secondary to adhesive disease, though he is a high-risk surgical candidate given his multiple abdominal surgeries in the past, pulmonary issues, and his chronic renal insufficiency. I discussed with the family and patient at length the issue of surgical intervention. UL his abdominal films looked worse today, he is still having significant output from ileostomy. I would like to continue trying a full liquid diet with advancement as tolerated. I feel the utility of surgical intervention in this patient is low, given his multiple admissions in the past for his poor small bowel motility. Will start erythromycin in hopes that this may stimulate his intestine.   LOS: 5 days    Kathalene Sporer A 08/10/2011

## 2011-08-11 LAB — CBC
HCT: 32.5 % — ABNORMAL LOW (ref 39.0–52.0)
MCHC: 32.3 g/dL (ref 30.0–36.0)
RDW: 15.3 % (ref 11.5–15.5)

## 2011-08-11 MED ORDER — SODIUM CHLORIDE 0.9 % IJ SOLN
INTRAMUSCULAR | Status: AC
  Administered 2011-08-11: 10 mL
  Filled 2011-08-11: qty 3

## 2011-08-11 NOTE — Progress Notes (Signed)
  Subjective: Tolerating soft diet well. Denies abdominal pain, nausea, or vomiting. He is having significant ileostomy output.  Objective: Vital signs in last 24 hours: Temp:  [97.6 F (36.4 C)-99.8 F (37.7 C)] 97.9 F (36.6 C) (01/08 0641) Pulse Rate:  [78-83] 78  (01/08 0641) Resp:  [16-20] 16  (01/08 0641) BP: (126-167)/(71-79) 126/79 mmHg (01/08 0641) SpO2:  [92 %-96 %] 93 % (01/08 0641) Weight:  [143.79 kg (317 lb)] 317 lb (143.79 kg) (01/08 0641) Last BM Date: 08/10/11  Intake/Output from previous day: 01/07 0701 - 01/08 0700 In: 3130 [P.O.:280; I.V.:2850] Out: 2000 [Urine:425; Stool:1575] Intake/Output this shift: Total I/O In: 370 [P.O.:360; I.V.:10] Out: -   General appearance: alert, cooperative and no distress GI: Soft, nontender. No obvious distention noted. Ileostomy patent.  Lab Results:   [facility] 08/11/11 0512  WBC 7.7  HGB 10.5*  HCT 32.5*  PLT 206   BMET  Basename 08/10/11 0523  NA 134*  K 3.9  CL 104  CO2 20  GLUCOSE 114*  BUN 47*  CREATININE 3.81*  CALCIUM 9.4   PT/INR No results found for this basename: LABPROT:2,INR:2 in the last 72 hours  Studies/Results: Dg Chest Port 1 View  08/09/2011  *RADIOLOGY REPORT*  Clinical Data: Shortness of breath  PORTABLE CHEST - 1 VIEW  Comparison: 08/05/2010  Findings: Mild patchy left lower lobe opacity, likely atelectasis. No pleural effusion or pneumothorax.  Stable mild cardiomegaly.  Again seen are dilated loops of small bowel in the upper abdomen.  IMPRESSION: Suspected left lower lobe atelectasis.  Mild cardiomegaly.  Original Report Authenticated By: Julian Hy, M.D.   Dg Abd Portable 2v  08/09/2011  *RADIOLOGY REPORT*  Clinical Data: Follow up small bowel obstruction.  PORTABLE ABDOMEN - 2 VIEW 08/09/2011 2033 hours:  Comparison: Two-view abdomen x-ray 08/07/2011, acute abdomen series 08/06/2011, CT abdomen and pelvis 08/05/2011.  Findings: Interval worsening of the small bowel distention  since the examination 2 days ago.  Gas is present within decompressed ascending colon.  Nasogastric tube has been removed in the interval.  Scattered air-fluid levels on the erect image without evidence of free intraperitoneal air.  IMPRESSION: Interval worsening of small bowel distention since the examination 2 days ago.  No free intraperitoneal air.  Original Report Authenticated By: Deniece Portela, M.D.    Anti-infectives: Anti-infectives     Start     Dose/Rate Route Frequency Ordered Stop   08/10/11 1700   erythromycin (ERY-TAB) EC tablet 333 mg        333 mg Oral 3 times daily with meals & bedtime 08/10/11 1209            Assessment/Plan: Assessment: Intestinal atony, resolving. Plan: Would continue erythromycin for several months. Its utility may decrease over time, but I do not have much more to offer him. He does not need acute surgical intervention. He should follow up with his primary care doctor.  LOS: 6 days    Cambre Matson A 08/11/2011

## 2011-08-11 NOTE — Progress Notes (Signed)
  Subjective:  The patient ate breakfast and lunch today with no increase in abdominal pain. He denies chest congestion or chest pain.   Objective: Vital signs in last 24 hours: Temp:  [97.6 F (36.4 C)-99.8 F (37.7 C)] 97.9 F (36.6 C) (01/08 0641) Pulse Rate:  [78-83] 78  (01/08 0641) Resp:  [16-20] 16  (01/08 0641) BP: (126-167)/(71-79) 126/79 mmHg (01/08 0641) SpO2:  [92 %-96 %] 93 % (01/08 0641) Weight:  [143.79 kg (317 lb)] 317 lb (143.79 kg) (01/08 0641) Last BM Date: 08/10/11  Intake/Output from previous day: 01/07 0701 - 01/08 0700 In: 3130 [P.O.:280; I.V.:2850] Out: 2000 [Urine:425; Stool:1575] Intake/Output this shift: Total I/O In: 370 [P.O.:360; I.V.:10] Out: -  General: Morbidly obese 71 year old after American man sitting up in bed, in no acute distress. Cardiovascular: Regular rate and rhythm, Q000111Q, soft 2/6 systolic ejection murmur Lungs: Clear anteriorly and decreased breath sounds in the bases. Abdomen: Morbidly obese, less epigastric tenderness, semisolid brown stool in the ostomy bag. Extremities: Trace of pedal edema. Chronic venous stasis changes bilaterally.  Lab Results:   [facility] 08/11/11 0512  WBC 7.7  HGB 10.5*  HCT 32.5*  PLT 206   BMET  Basename 08/10/11 0523  NA 134*  K 3.9  CL 104  CO2 20  GLUCOSE 114*  BUN 47*  CREATININE 3.81*  CALCIUM 9.4    Assessment/Plan: Small bowel obstruction versus intestinal atony versus both. The stools per his ostomy is now solidifying. He is tolerating his diet without any increase in abdominal pain over the past 24 hours. Dr. Arnoldo Morale' started erythromycin.    Wheezing/atelectasis. He is on when necessary Xopenex. Will add incentive spirometry. Also order a PT consult and try to mobilize the patient.  HYPERLIPIDEMIA :   OBESITY, MORBID : Stable.   Significant deconditioning. The physical therapist is recommending skilled nursing facility placement. Will plan on this at the time of  discharge. It is likely he could be discharged in the next 24-48 hours  HYPERTENSION: We'll discontinue IV metoprolol and continue his other antihypertensive medications.  VENOUS STASIS ULCER, CHRONIC : Stable.   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 His creatinine has been more or less stable.  Chronic iron deficiency anemia. Stable on ferrous sulfate.    LOS: 6 days    Zyona Pettaway 08/11/2011

## 2011-08-11 NOTE — Progress Notes (Signed)
Physical Therapy Treatment Patient Details Name: AREN SAUPE MRN: [id number] DOB: 23-Jan-1941 Today's Date: 08/11/2011 Time in/out: 13:36-14:00 Charges:  therex 13',  theract 10  PT Assessment/Plan  PT - Assessment/Plan Comments on Treatment Session: Attempted standing/ambulation, however pt. c/o pain in B LE with weight-bearing and unable to complete activity.  Pt. refused transfer to chair.  B LE ther ex completed in seated position this visit. Plan:  Continue Acute POC.  PT Treatment Precautions/Restrictions  Precautions Precautions: Fall Required Braces or Orthoses: No Restrictions Weight Bearing Restrictions: No Other Position/Activity Restrictions: external Colostomy bad Mobility (including Balance) Bed Mobility Sitting - Scoot to Edge of Bed: 6: Modified independent (Device/Increase time) Sitting - Scoot to Edge of Bed Details (indicate cue type and reason): Pt. takes quick, short scoots with little progress each time.  Pt uses whole body to scoot forward Transfers Transfers: Yes Sit to Stand Details (indicate cue type and reason): Attempted EOB to stand X 3 times but unable secondary to c/o pain when trying to bear weight on LE's.  Only exercises performed this visit. Ambulation/Gait Ambulation/Gait: No    Exercise  General Exercises - Lower Extremity Ankle Circles/Pumps: AROM;Both;10 reps;Seated Long Arc Quad: AROM;Both;10 reps;Seated Hip Flexion/Marching: AROM;Both;10 reps;Seated Toe Raises: AROM;Both;10 reps;Seated End of Session PT - End of Session Activity Tolerance: Patient limited by pain Patient left: in bed;with bed alarm set (Pt. refused transfer to chair) General Behavior During Session: [facility] for tasks performed Cognition: [facility] for tasks performed  Roseanne Reno B 08/11/2011, 2:45 PM

## 2011-08-12 ENCOUNTER — Encounter (HOSPITAL_COMMUNITY): Payer: Self-pay | Admitting: Internal Medicine

## 2011-08-12 DIAGNOSIS — E872 Acidosis, unspecified: Secondary | ICD-10-CM

## 2011-08-12 HISTORY — DX: Acidosis: E87.2

## 2011-08-12 HISTORY — DX: Acidosis, unspecified: E87.20

## 2011-08-12 LAB — BASIC METABOLIC PANEL
BUN: 52 mg/dL — ABNORMAL HIGH (ref 6–23)
Creatinine, Ser: 4.58 mg/dL — ABNORMAL HIGH (ref 0.50–1.35)
GFR calc Af Amer: 14 mL/min — ABNORMAL LOW (ref >90)
GFR calc non Af Amer: 12 mL/min — ABNORMAL LOW (ref >90)
Potassium: 4 mEq/L (ref 3.5–5.1)

## 2011-08-12 MED ORDER — METOPROLOL TARTRATE 25 MG PO TABS
25.0000 mg | ORAL_TABLET | Freq: Two times a day (BID) | ORAL | Status: DC
  Administered 2011-08-12 – 2011-08-13 (×3): 25 mg via ORAL
  Filled 2011-08-12 (×3): qty 1

## 2011-08-12 MED ORDER — POTASSIUM CHLORIDE IN NACL 20-0.9 MEQ/L-% IV SOLN
INTRAVENOUS | Status: DC
  Administered 2011-08-13: 07:00:00 via INTRAVENOUS

## 2011-08-12 MED ORDER — SODIUM CHLORIDE 0.45 % IV SOLN
INTRAVENOUS | Status: DC
  Administered 2011-08-12: 17:00:00 via INTRAVENOUS
  Filled 2011-08-12 (×2): qty 1000

## 2011-08-12 MED ORDER — SODIUM BICARBONATE 650 MG PO TABS
650.0000 mg | ORAL_TABLET | Freq: Three times a day (TID) | ORAL | Status: DC
  Administered 2011-08-13: 650 mg via ORAL
  Filled 2011-08-12: qty 1

## 2011-08-12 MED ORDER — ENOXAPARIN SODIUM 30 MG/0.3ML ~~LOC~~ SOLN
30.0000 mg | SUBCUTANEOUS | Status: DC
  Administered 2011-08-12: 30 mg via SUBCUTANEOUS
  Filled 2011-08-12: qty 0.3

## 2011-08-12 MED ORDER — SODIUM CHLORIDE 0.9 % IJ SOLN
INTRAMUSCULAR | Status: AC
  Filled 2011-08-12: qty 3

## 2011-08-12 NOTE — Progress Notes (Signed)
  Subjective: No nausea or vomiting. No significant abdominal pain. Tolerating soft bariatric diet well.  Objective: Vital signs in last 24 hours: Temp:  [98 F (36.7 C)-98.9 F (37.2 C)] 98 F (36.7 C) (01/09 0630) Pulse Rate:  [78] 78  (01/09 0630) Resp:  [20] 20  (01/09 0630) BP: (140-161)/(68-78) 149/70 mmHg (01/09 0630) SpO2:  [96 %-98 %] 96 % (01/09 0630) Last BM Date: 08/11/11  Intake/Output from previous day: 01/08 0701 - 01/09 0700 In: 1390 [P.O.:600; I.V.:790] Out: 750 [Urine:200; Stool:550] Intake/Output this shift:    GI: Soft, nontender, nondistended. Formed enteric contents and gas in ileostomy bag.  Lab Results:   [facility] 08/11/11 0512  WBC 7.7  HGB 10.5*  HCT 32.5*  PLT 206   BMET  Basename 08/12/11 0509 08/10/11 0523  NA 135 134*  K 4.0 3.9  CL 108 104  CO2 17* 20  GLUCOSE 126* 114*  BUN 52* 47*  CREATININE 4.58* 3.81*  CALCIUM 8.9 9.4   PT/INR No results found for this basename: LABPROT:2,INR:2 in the last 72 hours  Studies/Results: No results found.  Anti-infectives: Anti-infectives     Start     Dose/Rate Route Frequency Ordered Stop   08/10/11 1700   erythromycin (ERY-TAB) EC tablet 333 mg        333 mg Oral 3 times daily with meals & bedtime 08/10/11 1209            Assessment/Plan: Impression: Intestinal atony, not clinically significant at this time. Plan: No acute surgical intervention warranted at this time. Will sign off. Please call me if I can be of further assistance.  LOS: 7 days    Gelena Klosinski A 08/12/2011

## 2011-08-12 NOTE — Consult Note (Signed)
[name] Snow MRN: [id number] DOB/AGE: 1941-01-20 71 y.o. Primary Care Physician:Margaret Moshe Cipro, MD, MD Admit date: 08/05/2011 Chief Complaint:  Chief Complaint  Patient presents with  . Emesis   HPI:  Patient is a 71 year old AAM with past medical history of subtotal colectomy and colostomy , chronic pancreatitis, HTN,  [facility] with  increasing nausea vomiting and foul-smelling material coming from his colostomy.  Pt presented to the ER for further evaluation . Pt had  a CT scan of the abdomen and it showed high-grade small bowel obstruction.  Pt was admitted for SBO Pt better than before  NO c/o Chest pain  NO c/o Dyspnea  No c/o syncope Pt says he is feeling better than yesterday   Past Medical History  Diagnosis Date  . Chronic pancreatitis   . Hyperlipidemia   . Colostomy in place   . Intestinal obstruction   . Alcohol abuse   . Morbid obesity   . Hypertension   . Dialysis care   . Metabolic acidosis [id number]        Family History  Problem Relation Age of Onset  . Hypertension Brother     Social History:  reports that he has never smoked. He does not have any smokeless tobacco history on file. He reports that he does not drink alcohol or use illicit drugs.   Allergies: No Known Allergies  Medications Prior to Admission  Medication Dose Route Frequency Provider Last Rate Last Dose  . acetaminophen (TYLENOL) tablet 650 mg  650 mg Oral Q6H PRN Annita Brod, MD   650 mg at 08/08/11 1650   Or  . acetaminophen (TYLENOL) suppository 650 mg  650 mg Rectal Q6H PRN Annita Brod, MD      . cloNIDine (CATAPRES) tablet 0.2 mg  0.2 mg Oral QHS Annita Brod, MD   0.2 mg at 08/11/11 2200  . enoxaparin (LOVENOX) injection 30 mg  30 mg Subcutaneous Q24H Scott A Hall, PHARMD   30 mg at 08/12/11 1653  . erythromycin (ERY-TAB) EC tablet 333 mg  333 mg Oral TID WC & HS Mark A Jenkins   333 mg at 08/12/11 1653  . ferrous sulfate tablet 325 mg  325  mg Oral BID Annita Brod, MD   325 mg at 08/12/11 1017  . hydrALAZINE (APRESOLINE) injection 10 mg  10 mg Intravenous Q8H PRN Annita Brod, MD   10 mg at 08/08/11 0209  . hydrALAZINE (APRESOLINE) injection 25 mg  25 mg Intravenous Once Rachal A David   25 mg at 08/08/11 [id number]  . levalbuterol (XOPENEX) nebulizer solution 0.63 mg  0.63 mg Nebulization Q6H PRN Annita Brod, MD      . metoprolol tartrate (LOPRESSOR) tablet 25 mg  25 mg Oral BID Rexene Alberts   25 mg at 08/12/11 1653  . morphine 2 MG/ML injection 2 mg  2 mg Intravenous Q3H PRN Annita Brod, MD   2 mg at 08/11/11 0313  . morphine 2 MG/ML injection 2 mg  2 mg Intravenous Once Rachal A David   2 mg at 08/08/11 2144  . ondansetron (ZOFRAN) injection 4 mg  4 mg Intravenous Once Mervin Kung, MD   4 mg at 08/05/11 1010  . ondansetron (ZOFRAN) injection 4 mg  4 mg Intravenous Once Mervin Kung, MD   4 mg at 08/05/11 1316  . ondansetron (ZOFRAN) tablet 4 mg  4 mg Oral Q6H PRN Annita Brod, MD  Or  . ondansetron (ZOFRAN) injection 4 mg  4 mg Intravenous Q6H PRN Annita Brod, MD   4 mg at 08/09/11 1242  . pantoprazole (PROTONIX) injection 40 mg  40 mg Intravenous Q24H Annita Brod, MD   40 mg at 08/11/11 2030  . sodium chloride 0.45 % 1,000 mL with potassium chloride 20 mEq, sodium bicarbonate 50 mEq infusion   Intravenous Continuous Denise Fisher 70 mL/hr at 08/12/11 1645    . sodium chloride 0.9 % injection           . sodium chloride 0.9 % injection           . sodium chloride 0.9 % injection           . sodium chloride 0.9 % injection        10 mL at 08/11/11 0923  . DISCONTD: 0.9 %  sodium chloride infusion   Intravenous Continuous Denise Fisher 60 mL/hr at 08/11/11 1500    . DISCONTD: 0.9 %  sodium chloride infusion   Intravenous Continuous Annita Brod, MD      . DISCONTD: allopurinol (ZYLOPRIM) tablet 300 mg  300 mg Oral Daily Annita Brod, MD   300 mg at 08/09/11 1117  .  DISCONTD: cloNIDine (CATAPRES - Dosed in mg/24 hr) patch 0.2 mg  0.2 mg Transdermal Weekly Annita Brod, MD   0.2 mg at 08/05/11 2126  . DISCONTD: cloNIDine (CATAPRES - Dosed in mg/24 hr) patch 0.3 mg  0.3 mg Transdermal Weekly Annita Brod, MD      . DISCONTD: hydrALAZINE (APRESOLINE) injection 5 mg  5 mg Intravenous Q8H PRN Annita Brod, MD      . DISCONTD: hydrochlorothiazide (HYDRODIURIL) tablet 25 mg  25 mg Oral Daily Annita Brod, MD   25 mg at 08/09/11 1117  . DISCONTD: metoprolol (LOPRESSOR) injection 5 mg  5 mg Intravenous Q6H Annita Brod, MD   5 mg at 08/08/11 1145  . DISCONTD: metoprolol (LOPRESSOR) injection 5 mg  5 mg Intravenous Q6H Annita Brod, MD   5 mg at 08/11/11 1459  . DISCONTD: morphine 2 MG/ML injection 1 mg  1 mg Intravenous Q3H Annita Brod, MD   1 mg at 08/11/11 [id number]  . DISCONTD: nitroGLYCERIN (NITROGLYN) 2 % ointment 1 inch  1 inch Topical Q6H Annita Brod, MD   1 inch at 08/08/11 1145  . DISCONTD: olmesartan (BENICAR) tablet 40 mg  40 mg Oral Daily Annita Brod, MD   40 mg at 08/11/11 0923  . DISCONTD: pantoprazole (PROTONIX) EC tablet 40 mg  40 mg Oral QHS Annita Brod, MD      . DISCONTD: pantoprazole (PROTONIX) injection 40 mg  40 mg Intravenous Q24H Annita Brod, MD   40 mg at 08/08/11 2143  . DISCONTD: torsemide (DEMADEX) tablet 20 mg  20 mg Oral Daily Annita Brod, MD   20 mg at 08/09/11 1116   Medications Prior to Admission  Medication Sig Dispense Refill  . allopurinol (ZYLOPRIM) 300 MG tablet TAKE 1 TABLET EVERY DAY FOR GOUT  30 tablet  3  . cloNIDine (CATAPRES) 0.2 MG tablet Take 0.2 mg by mouth at bedtime.        . ferrous sulfate ([facility] IRON) 325 (65 FE) MG tablet Take 325 mg by mouth 2 (two) times daily. Take one tablet by mouth twice a day       . torsemide (DEMADEX) 20 MG tablet Take 20  mg by mouth daily. Take 2 tablets by mouth once daily       . valsartan-hydrochlorothiazide (DIOVAN HCT) 320-25  MG per tablet Take 1 tablet by mouth daily.  30 tablet  4       [id number] from the symptoms mentioned above,there are no other symptoms referable to all systems reviewed.     . cloNIDine  0.2 mg Oral QHS  . enoxaparin (LOVENOX) injection  30 mg Subcutaneous Q24H  . erythromycin  333 mg Oral TID WC & HS  . ferrous sulfate  325 mg Oral BID  . metoprolol tartrate  25 mg Oral BID  . pantoprazole (PROTONIX) IV  40 mg Intravenous Q24H  . DISCONTD: olmesartan  40 mg Oral Daily         [id number] from the symptoms mentioned above,there are no other symptoms referable to all systems reviewed.  Physical Exam: Vital signs in last 24 hours: Temp:  [97.6 F (36.4 C)-98.9 F (37.2 C)] 97.6 F (36.4 C) (01/09 1400) Pulse Rate:  [74-78] 74  (01/09 1400) Resp:  [20] 20  (01/09 1400) BP: (128-161)/(67-78) 128/67 mmHg (01/09 1400) SpO2:  [96 %-99 %] 99 % (01/09 1400) Weight change:  Last BM Date: 08/11/11  Intake/Output from previous day: 01/08 0701 - 01/09 0700 In: 1390 [P.O.:600; I.V.:790] Out: 750 [Urine:200; Stool:550] Total I/O In: 600 [P.O.:600] Out: 300 [Urine:300]   Physical Exam: General- pt is awake,alert, oriented to time place and person Resp- No acute REsp distress, CTA B/L NO Rhonchi CVS- S1S2 regular in rate and rhythm GIT- BS+, soft, NT, ND EXT- Chronic venous stasis changes CNS- CN 2-12 grossly intact.Psych- normal mod and affect Access-left  AVG- Bruit +   Lab Results: CBC  Basename 08/11/11 0512  WBC 7.7  HGB 10.5*  HCT 32.5*  PLT 206    BMET  Basename 08/12/11 0509 08/10/11 0523  NA 135 134*  K 4.0 3.9  CL 108 104  CO2 17* 20  GLUCOSE 126* 114*  BUN 52* 47*  CREATININE 4.58* 3.81*  CALCIUM 8.9 9.4    Creat TRend 2013  4.5<==3.8<==3.5<==3.2 2012   3.1--3.5 2011  3.3--4.0 2010  3.1--3.2 2009  1.6--2.0 2008   1.6--2.5  MICRO No results found for this or any previous visit (from the past 240 hour(s)).    Lab Results  Component  Value Date   PTH 277.4* 05/25/2011   CALCIUM 8.9 08/12/2011   PHOS 4.1 05/25/2011      Impression: 1)Renal  AKI secondary to PreRenal  Vs ATN                AKI secondary to SIRS condition                AKI on CKD                CKD stage 4.               CKD since 2008( [facility] before that)               CKD secondary to HTN/ DM                Progression of CKD _ marked with AKI                Proteinura will check                2)HTN at goal for the SIRS state  Target Organ damage              CKD               Medication-            On RAS blockers-now dc/ed            On Beta blockers- now d/ced           On Vasodilators- Hydralzine           On Central Acting Sympatholytics- Clonidine   3)Anemia HGb at goal (9--11)    4)CKD Mineral-Bone Disorder PTH elevated Secondary Hyperparathyroidism present Phosphorus at goal.   5)GI- admitted with SBO- clinicaly better.  6)FEN  Normokalemic NOrmonatremic Hypovolemic vs Euvolemic- with colostomy  Losses + Obesity + Chronic changes its very hard to judge. BUt with hx of GI losses and NO crackles on exam and rising creat will think more about Hypovolemia   7)Acid base Co2 not at goal    Plan:  Will suggest to change IVF to NS @ 100 ml.hr-after the current bag is done Will start PO Sodium Bicarb 650mg  PO TID Agree with Stopping ACE will ask for CKDBMD labs NO need of HD as yet      BHUTANI,MANPREET S 08/12/2011, 4:59 PM

## 2011-08-12 NOTE — Progress Notes (Signed)
CSW presented bed offers to pt and pt's aunt and niece.  They choose [facility].  Facility notified.  Awaiting stability for d/c.   [name] Snow

## 2011-08-12 NOTE — Progress Notes (Signed)
  Subjective: The patient has no complaints of abdominal pain, nausea, and vomiting. He continues to tolerate his diet.   Objective: Vital signs in last 24 hours: Temp:  [98 F (36.7 C)-98.9 F (37.2 C)] 98 F (36.7 C) (01/09 0630) Pulse Rate:  [78] 78  (01/09 0630) Resp:  [20] 20  (01/09 0630) BP: (140-161)/(68-78) 149/70 mmHg (01/09 0630) SpO2:  [96 %-98 %] 96 % (01/09 0630) Last BM Date: 08/11/11  Intake/Output from previous day: 01/08 0701 - 01/09 0700 In: 1390 [P.O.:600; I.V.:790] Out: 750 [Urine:200; Stool:550] Intake/Output this shift:   General: Morbidly obese 71 year old after American man sitting up in bed, in no acute distress. Cardiovascular: Regular rate and rhythm, [id number], soft 2/6 systolic ejection murmur Lungs: Clear anteriorly and decreased breath sounds in the bases. Abdomen: Morbidly obese, less epigastric tenderness, loose brownish green stool in the ostomy bag. Extremities: Trace to 1+ chronic bilateral pedal edema. Chronic venous stasis changes bilaterally.  Lab Results:   [facility] 08/11/11 0512  WBC 7.7  HGB 10.5*  HCT 32.5*  PLT 206   BMET  Basename 08/12/11 0509 08/10/11 0523  NA 135 134*  K 4.0 3.9  CL 108 104  CO2 17* 20  GLUCOSE 126* 114*  BUN 52* 47*  CREATININE 4.58* 3.81*  CALCIUM 8.9 9.4    Assessment/Plan: Small bowel obstruction versus intestinal atony versus both. Clinically resolved. The stools per his ostomy are not watery anymore.Marland Kitchen He is tolerating his diet without any increase in abdominal pain over the past 24 hours. Dr. Arnoldo Morale' started erythromycin.    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 He has an AV fistula that was placed sometime ago. He is not on dialysis yet. His creatinine had been more or less stable, but over the past 24 hours, his creatinine has increased from 3.81-4.58. Etiology is unclear. Given the increase, will discontinue Benicar. We'll also consider increasing the rate of IV  fluids and starting Lasix, but will defer these changes to nephrology who will be consulted. We'll order strict ins and outs. We'll order daily renal panels.  Metabolic acidosis. This may be secondary to the recently high ostomy output versus worsening renal function. Will add bicarbonate to the IV fluids until he is assessed by nephrology.  Wheezing/atelectasis. He is on when necessary Xopenex. Will add incentive spirometry. Also order a PT consult and try to mobilize the patient.   HYPERTENSION: IV metoprolol was discontinued. Will start oral metoprolol given that Benicar was discontinued due to worsening renal function.  Hyperlipidemia  OBESITY, MORBID : Stable.  Significant deconditioning. The physical therapist is recommending skilled nursing facility placement. He has agreed.  VENOUS STASIS ULCER, CHRONIC : Stable.  Chronic iron deficiency anemia. Stable on ferrous sulfate.    LOS: 7 days    Diamone Whistler 08/12/2011

## 2011-08-13 LAB — VITAMIN D 25 HYDROXY (VIT D DEFICIENCY, FRACTURES): Vit D, 25-Hydroxy: 20 ng/mL — ABNORMAL LOW (ref 30–89)

## 2011-08-13 LAB — PROTEIN, URINE, RANDOM: Total Protein, Urine: 102 mg/dL

## 2011-08-13 LAB — RENAL FUNCTION PANEL
Albumin: 2.4 g/dL — ABNORMAL LOW (ref 3.5–5.2)
Calcium: 8.8 mg/dL (ref 8.4–10.5)
Chloride: 109 mEq/L (ref 96–112)
Creatinine, Ser: 3.98 mg/dL — ABNORMAL HIGH (ref 0.50–1.35)
GFR calc non Af Amer: 14 mL/min — ABNORMAL LOW (ref >90)

## 2011-08-13 LAB — PHOSPHORUS: Phosphorus: 3.3 mg/dL (ref 2.3–4.6)

## 2011-08-13 MED ORDER — METOPROLOL SUCCINATE ER 50 MG PO TB24: 50.0000 mg | ORAL_TABLET | Freq: Every day | ORAL | Status: DC

## 2011-08-13 MED ORDER — LEVALBUTEROL HCL 0.63 MG/3ML IN NEBU: 0.6300 mg | INHALATION_SOLUTION | Freq: Four times a day (QID) | RESPIRATORY_TRACT | Status: DC | PRN

## 2011-08-13 MED ORDER — OXYCODONE HCL 5 MG PO TABS: 5.0000 mg | ORAL_TABLET | ORAL | Status: AC | PRN

## 2011-08-13 MED ORDER — ONDANSETRON HCL 4 MG PO TABS: 4.0000 mg | ORAL_TABLET | Freq: Three times a day (TID) | ORAL | Status: AC | PRN

## 2011-08-13 MED ORDER — TORSEMIDE 20 MG PO TABS
50.0000 mg | ORAL_TABLET | Freq: Every day | ORAL | Status: DC
  Administered 2011-08-13: 50 mg via ORAL
  Filled 2011-08-13: qty 3

## 2011-08-13 MED ORDER — SODIUM BICARBONATE 650 MG PO TABS: 650.0000 mg | ORAL_TABLET | Freq: Three times a day (TID) | ORAL | Status: DC

## 2011-08-13 MED ORDER — VALSARTAN 320 MG PO TABS: 320.0000 mg | ORAL_TABLET | Freq: Every day | ORAL | Status: DC

## 2011-08-13 MED ORDER — ERYTHROMYCIN BASE 333 MG PO TBEC: 333.0000 mg | DELAYED_RELEASE_TABLET | Freq: Three times a day (TID) | ORAL | Status: AC

## 2011-08-13 NOTE — Progress Notes (Signed)
Subjective: Interval History: No nausea vomiting. Patient seems to be feeling better. He denies also any difficulty increasing.    heObjective: Vital signs in last 24 hours: Temp:  [97.5 F (36.4 C)-98 F (36.7 C)] 97.5 F (36.4 C) (01/10 0615) Pulse Rate:  [70-76] 70  (01/10 0615) Resp:  [20] 20  (01/10 0615) BP: (128-147)/(60-68) 145/68 mmHg (01/10 0615) SpO2:  [97 %-100 %] 97 % (01/10 0615) Weight change:   Intake/Output from previous day: 01/09 0701 - 01/10 0700 In: 2085.8 [P.O.:600; I.V.:1485.8] Out: 1625 [Urine:1400; Stool:225] Intake/Output this shift: Total I/O In: 260 [P.O.:260] Out: -   General appearance: alert and no distress Resp: clear to auscultation bilaterally Cardio: regular rate and rhythm, S1, S2 normal, no murmur, click, rub or gallop GI: soft, non-tender; bowel sounds normal; no masses,  no organomegaly Extremities: extremities normal, atraumatic, no cyanosis or edema and edema trace  to 1+ edema.  Lab Results:  [facility] 08/11/11 0512  WBC 7.7  HGB 10.5*  HCT 32.5*  PLT 206   BMET:  Basename 08/13/11 0521 08/12/11 0509  NA 136 135  K 4.3 4.0  CL 109 108  CO2 17* 17*  GLUCOSE 110* 126*  BUN 46* 52*  CREATININE 3.98* 4.58*  CALCIUM 8.8 8.9   No results found for this basename: PTH:2 in the last 72 hours Iron Studies: No results found for this basename: IRON,TIBC,TRANSFERRIN,FERRITIN in the last 72 hours  Studies/Results: No results found.  I have reviewed the patient's current medications.  Assessment/Plan:  problem #1 renal failure her chronic patient with underlying stage IV renal failure. Presently came with nausea vomiting secondary to ileus. His BUN and creatinine has improved and reached his baseline. And patient doesn't have any uremic sinus symptoms. Problem #2 history of ileus seems to be improving presently patient seems to be. Problem #3 history of anemia is secondary to chronic renal failure his hemoglobin and hematocrit is  stable Problem #4 history of hypertension blood pressure is controlled very well Problem #5 history of her morbid obesity Problem #6 history of metabolic acidosis he is on sodium bicarbonate as outpatient Problem #7 history of her recurrent CHF presently he doesn't have any significant sign of fluid overload he is on the [facility] as an outpatient. Recommendation we'll DC IV fluid to We'll put him on Demadex 40 mg once a daily We'll see patient in 4 weeks as an outpatient.    LOS: 8 days   Deidrick Rainey S 08/13/2011,9:22 AM

## 2011-08-13 NOTE — Progress Notes (Signed)
Pt d/c today by MD to [facility].  Pt, pt's sister, and facility aware and agreeable. Pt to transfer via facility van.  No other needs reported.  Salome Arnt

## 2011-08-13 NOTE — Progress Notes (Signed)
Pt discharge to [place] with packet and transferred via their Lucianne Lei.  Colostomy emptied and dressed prior to leaving the unit.  Report later called to Willaim Rayas.  She verbalizes understanding and voiced no further complaints or concerns at this time.  Voiced to her that she could call me up to 7 pm for any other questions.

## 2011-08-13 NOTE — Discharge Summary (Signed)
Physician Discharge Summary  [name] Snow MRN: [id number] DOB/AGE: 1941/06/14 71 y.o.  PCP: Tula Nakayama, MD, MD   Admit date: 08/05/2011 Discharge date: 08/13/2011  Discharge Diagnoses:  1. 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 He is a high risk surgical candidate. No operative intervention was needed. 2. 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 3. Metabolic acidosis, likely secondary to progressive renal failure. 4. Malignant hypertension. 5. Pulmonary atelectasis. 6. Chronic iron deficiency anemia/anemia of chronic kidney disease. 7. Morbid obesity and associated severe deconditioning. 8. Chronic lower extremity edema and venous stasis ulcerations.     Current Discharge Medication List    START taking these medications   Details  erythromycin (ERY-TAB) 333 MG EC tablet Take 1 tablet (333 mg total) by mouth 4 (four) times daily -  with meals and at bedtime. FOR INTESTINAL MOTILITY,. Qty: 90 tablet, Refills: 1    levalbuterol (XOPENEX) 0.63 MG/3ML nebulizer solution Take 3 mLs (0.63 mg total) by nebulization every 6 (six) hours as needed for wheezing or shortness of breath. Qty: 3 mL, Refills: 1    metoprolol (TOPROL XL) 50 MG 24 hr tablet Take 1 tablet (50 mg total) by mouth daily.    ondansetron (ZOFRAN) 4 MG tablet Take 1 tablet (4 mg total) by mouth every 8 (eight) hours as needed for nausea. Qty: 20 tablet, Refills: 0    oxyCODONE (ROXICODONE) 5 MG immediate release tablet Take 1 tablet (5 mg total) by mouth every 4 (four) hours as needed for pain. Qty: 30 tablet, Refills: 0    sodium bicarbonate 650 MG tablet Take 1 tablet (650 mg total) by mouth 3 (three) times daily.    valsartan (DIOVAN) 320 MG tablet Take 1 tablet (320 mg total) by mouth daily.      CONTINUE these medications which have NOT CHANGED   Details  allopurinol (ZYLOPRIM) 300 MG tablet TAKE 1 TABLET EVERY DAY FOR GOUT Qty: 30 tablet,  Refills: 3    cloNIDine (CATAPRES) 0.2 MG tablet Take 0.2 mg by mouth at bedtime.      ferrous sulfate ([employer] IRON) 325 (65 FE) MG tablet Take 325 mg by mouth 2 (two) times daily. Take one tablet by mouth twice a day     torsemide (DEMADEX) 20 MG tablet Take 20 mg by mouth daily. Take 2 tablets by mouth once daily       STOP taking these medications     valsartan-hydrochlorothiazide (DIOVAN HCT) 320-25 MG per tablet      simvastatin (ZOCOR) 80 MG tablet         Discharge Condition: Improved and stable.  Disposition: Skilled nursing facility.   Consults: General surgeon, Dr. Arnoldo Morale and nephrologist, Dr. Lowanda Foster.   Significant Diagnostic Studies: Ct Abdomen Pelvis Wo Contrast  08/05/2011  *RADIOLOGY REPORT*  Clinical Data: Emesis.  History of pancreatitis.  Abnormal renal function.  CT ABDOMEN AND PELVIS WITHOUT CONTRAST  Technique:  Multidetector CT imaging of the abdomen and pelvis was performed following the standard protocol without intravenous contrast.  Comparison: 08/01/2007  Findings: There is mild patchy atelectasis at both lung bases. Pneumonia is not excluded.  No pleural or pericardial fluid.  The liver has a normal appearance without contrast.  There is been previous cholecystectomy.  The spleen is normal.  The pancreas is normal.  The adrenal glands are normal.  The right kidney is atrophic and contains  small nonobstructing calculi.  The left kidney is within normal limits in  size and does not show any focal lesions.  No sign of obstruction.  There is marked distention of the stomach and the proximal small intestine consistent with high-grade small bowel obstruction.  The patient appears to have had sub total colectomy.  There is an ostomy in the right abdomen.  There is no evidence of bowel perforation.  No evidence of pelvic mass or adenopathy.  IMPRESSION: High-grade small bowel obstruction, probably due to adhesions. Previous sub total colectomy.  Ostomy in the right  abdomen.  Atrophic right kidney containing small nonobstructing calculi.  No gross abnormality of the left kidney.  Original Report Authenticated By: Jules Schick, M.D.   Dg Chest Port 1 View  08/09/2011  *RADIOLOGY REPORT*  Clinical Data: Shortness of breath  PORTABLE CHEST - 1 VIEW  Comparison: 08/05/2010  Findings: Mild patchy left lower lobe opacity, likely atelectasis. No pleural effusion or pneumothorax.  Stable mild cardiomegaly.  Again seen are dilated loops of small bowel in the upper abdomen.  IMPRESSION: Suspected left lower lobe atelectasis.  Mild cardiomegaly.  Original Report Authenticated By: Julian Hy, M.D.   Dg Abd 2 Views  08/07/2011  *RADIOLOGY REPORT*  Clinical Data: Small bowel obstruction, follow-up  ABDOMEN - 2 VIEW  Comparison: 08/06/2011  Findings: Nasogastric tube in stomach. Persistent dilatation of bowel loops in the abdomen though bowel gas pattern is suboptimally assessed due to patient body habitus and motion. No gross evidence of free intraperitoneal air though exam cannot exclude free air. Bones appear demineralized.  IMPRESSION: Suboptimal visualization of bowel gas pattern due to motion, positioning, and body habitus. Persistent dilatation of bowel loops is identified.  Original Report Authenticated By: Burnetta Sabin, M.D.   Acute Abdominal Series  08/06/2011  *RADIOLOGY REPORT*  Clinical Data: Follow up high-grade small bowel obstruction  ACUTE ABDOMEN SERIES (ABDOMEN 2 VIEW & CHEST 1 VIEW)  Comparison: Chest x-ray of 09/15/2010 and CT abdomen pelvis of 08/05/2011  Findings: The lungs are poorly aerated with basilar atelectasis left greater than right.  Pneumonia at the left lung base cannot be excluded.  Cardiomegaly is stable.  An NG tube is noted within the stomach.  There is little change in significant gaseous distention of small bowel loops consistent with small bowel obstruction.  An NG tube is noted coiling within the fundus and proximal body of the stomach.  No free air is seen on the erect view.  IMPRESSION:  1.  Little change in small bowel obstruction.  NG tube coils in the proximal body of  the stomach. 2.  Diminished aeration with increasing opacity at the left lung base.  Consider atelectasis or pneumonia.  Recommend follow-up.  Original Report Authenticated By: Joretta Bachelor, M.D.   Dg Abd Portable 2v  08/09/2011  *RADIOLOGY REPORT*  Clinical Data: Follow up small bowel obstruction.  PORTABLE ABDOMEN - 2 VIEW 08/09/2011 2033 hours:  Comparison: Two-view abdomen x-ray 08/07/2011, acute abdomen series 08/06/2011, CT abdomen and pelvis 08/05/2011.  Findings: Interval worsening of the small bowel distention since the examination 2 days ago.  Gas is present within decompressed ascending colon.  Nasogastric tube has been removed in the interval.  Scattered air-fluid levels on the erect image without evidence of free intraperitoneal air.  IMPRESSION: Interval worsening of small bowel distention since the examination 2 days ago.  No free intraperitoneal air.  Original Report Authenticated By: Deniece Portela, M.D.     Microbiology: No results found for this or any previous visit (from the past  240 hour(s)).   Labs: Results for orders placed during the hospital encounter of 08/05/11 (from the past 48 hour(s))  BASIC METABOLIC PANEL     Status: Abnormal   Collection Time   08/12/11  5:09 AM      Component Value Range Comment   Sodium 135  135 - 145 (mEq/L)    Potassium 4.0  3.5 - 5.1 (mEq/L)    Chloride 108  96 - 112 (mEq/L)    CO2 17 (*) 19 - 32 (mEq/L)    Glucose, Bld 126 (*) 70 - 99 (mg/dL)    BUN 52 (*) 6 - 23 (mg/dL)    Creatinine, Ser 4.58 (*) 0.50 - 1.35 (mg/dL)    Calcium 8.9  8.4 - 10.5 (mg/dL)    GFR calc non Af Amer 12 (*) >90 (mL/min)    GFR calc Af Amer 14 (*) >90 (mL/min)   CREATININE, URINE, RANDOM     Status: Normal   Collection Time   08/12/11  5:16 PM      Component Value Range Comment   Creatinine, Urine 149.29     PROTEIN,  URINE, RANDOM     Status: Normal   Collection Time   08/12/11  5:16 PM      Component Value Range Comment   Total Protein, Urine 102   NO NORMAL RANGE ESTABLISHED FOR THIS TEST  RENAL FUNCTION PANEL     Status: Abnormal   Collection Time   08/13/11  5:21 AM      Component Value Range Comment   Sodium 136  135 - 145 (mEq/L)    Potassium 4.3  3.5 - 5.1 (mEq/L)    Chloride 109  96 - 112 (mEq/L)    CO2 17 (*) 19 - 32 (mEq/L)    Glucose, Bld 110 (*) 70 - 99 (mg/dL)    BUN 46 (*) 6 - 23 (mg/dL)    Creatinine, Ser 3.98 (*) 0.50 - 1.35 (mg/dL)    Calcium 8.8  8.4 - 10.5 (mg/dL)    Phosphorus 3.2  2.3 - 4.6 (mg/dL)    Albumin 2.4 (*) 3.5 - 5.2 (g/dL)    GFR calc non Af Amer 14 (*) >90 (mL/min)    GFR calc Af Amer 16 (*) >90 (mL/min)   PHOSPHORUS     Status: Normal   Collection Time   08/13/11  5:21 AM      Component Value Range Comment   Phosphorus 3.3  2.3 - 4.6 (mg/dL)      HPI : The patient is a 71 year old man with a past medical history significant for a subtotal colectomy, colostomy, near end-stage renal disease, and hypertension. He presented to the emergency department on 08/05/2011 with a chief complaint of abdominal pain. In the emergency department, he was afebrile and hypertensive with a blood pressure ranging from 163/78-195/67. His WBC was slightly elevated at 12.5. His lipase was within normal limits at 55. His liver transaminases were within normal limits. His creatinine was 3.03. The CT scan of his abdomen and pelvis was suggestive of a high-grade small bowel obstruction. He was admitted for further evaluation and management.  HOSPITAL COURSE: An NG tube was placed in the emergency department. It was continued. He was started empirically on proton pump inhibitor therapy IV, as needed analgesics, and as needed antiemetics. IV fluids were started for hydration. IV metoprolol and a clonidine patch was placed for treatment of malignant hypertension. IV hydralazine was ordered as needed  as well. General surgeon Dr.  Aviva Signs was consulted. Following his evaluation, he agreed with medical management. He did not believe the patient needed urgent operative intervention. He later stated that the patient would be extremely high risk for an operation given his physical deconditioning and comorbidities conditions. He felt that the patient had more intestinal atony than a high-grade small bowel obstruction.   The patient's bowel function began to improve as he had significant ostomy output. Initially, the stools were watery and brown, however, eventually his stools solidified or became semisolid. The NG tube was clamped and then discontinued when he was able to tolerate a clear liquid diet. Dr. Arnoldo Morale advanced his diet to a clear liquid diet and then subsequently to a dysphagia 3 diet. He then added erythromycin to see if it would increase or maintain intestinal motility. The patient is being maintained on erythromycin however, the ongoing management will need to be monitored in the outpatient setting.  The patient's renal function worsened during the hospitalization. He has near end-stage kidney disease but dialysis has not started yet. He also became mildly acidotic, likely secondary to acute on chronic kidney disease. Of note the Demadex was withheld during the hospitalization because of the patient's n.p.o. status and high ostomy output. He was hydrated with IV fluids during the entire hospitalization. Eventually the valsartan was temporarily discontinued. Nephrology was consulted. The rate of IV fluids was increased. Bicarbonate was added. His renal function improved a little prior to discharge. I discussed restarting the ARB and Demadex with Dr. Lowanda Foster. He stated that he would be okay to restart both but he would followup with the patient next week.   Metoprolol was added during the time the valsartan was discontinued because his blood pressure increased significantly. At the time of  discharge, he was transitioned to long acting Toprol XL. He was maintained on clonidine as before. HCTZ was discontinued.    Discharge Exam: Blood pressure 145/68, pulse 70, temperature 97.5 F (36.4 C), temperature source Oral, resp. rate 20, height 5\' 6"  (1.676 m), weight 143.79 kg (317 lb), SpO2 97.00%.  Lungs: Clear to auscultation bilaterally. Heart: S1, S2, with a soft systolic murmur. Abdomen: Morbidly obese, soft, nontender. Ostomy bag in the right upper quadrant reveals semi-solid brownish green stool. Extremities: Trace to 1+ chronic bilateral lower extremity edema with chronic venous changes.   Discharge Orders    Future Orders Please Complete By Expires   Diet - low sodium heart healthy      Increase activity slowly      Discharge instructions      Comments:   HE WILL NEED TO FOLLOW UP WITH DR. Lowanda Foster  AS SCHEDULED.   Discharge wound care:      Comments:   OSTOMY CARE IS NEEDED SEVERAL TIMES DAILY.      Follow-up Information    Follow up with [facility], [place] on 08/19/2011. (AT 2:00 PM)    Contact information:   [place] [phone]          Signed: Defne Gerling 08/13/2011, 12:38 PM

## 2011-08-14 LAB — PTH, INTACT AND CALCIUM
Calcium, Total (PTH): 7.7 mg/dL — ABNORMAL LOW (ref 8.4–10.5)
PTH: 282.4 pg/mL — ABNORMAL HIGH (ref 14.0–72.0)

## 2011-09-09 ENCOUNTER — Encounter (HOSPITAL_COMMUNITY): Payer: Medicare Other | Attending: Nephrology

## 2011-09-09 DIAGNOSIS — N186 End stage renal disease: Secondary | ICD-10-CM | POA: Insufficient documentation

## 2011-09-09 DIAGNOSIS — D649 Anemia, unspecified: Secondary | ICD-10-CM | POA: Insufficient documentation

## 2011-09-09 LAB — RENAL FUNCTION PANEL
BUN: 27 mg/dL — ABNORMAL HIGH (ref 6–23)
Chloride: 101 mEq/L (ref 96–112)
Glucose, Bld: 115 mg/dL — ABNORMAL HIGH (ref 70–99)
Potassium: 4.3 mEq/L (ref 3.5–5.1)

## 2011-09-09 MED ORDER — EPOETIN ALFA 10000 UNIT/ML IJ SOLN
10000.0000 [IU] | Freq: Once | INTRAMUSCULAR | Status: AC
  Administered 2011-09-09: 10000 [IU] via SUBCUTANEOUS

## 2011-09-09 NOTE — Progress Notes (Signed)
Tolerated inj and venipuncture well.

## 2011-09-10 ENCOUNTER — Ambulatory Visit (INDEPENDENT_AMBULATORY_CARE_PROVIDER_SITE_OTHER): Payer: Medicare Other | Admitting: Family Medicine

## 2011-09-10 ENCOUNTER — Encounter: Payer: Self-pay | Admitting: Family Medicine

## 2011-09-10 VITALS — BP 170/90 | HR 116 | Resp 18 | Wt 314.0 lb

## 2011-09-10 DIAGNOSIS — I10 Essential (primary) hypertension: Secondary | ICD-10-CM

## 2011-09-10 DIAGNOSIS — K56609 Unspecified intestinal obstruction, unspecified as to partial versus complete obstruction: Secondary | ICD-10-CM

## 2011-09-10 DIAGNOSIS — D649 Anemia, unspecified: Secondary | ICD-10-CM

## 2011-09-10 DIAGNOSIS — E785 Hyperlipidemia, unspecified: Secondary | ICD-10-CM

## 2011-09-10 NOTE — Patient Instructions (Signed)
F/U in 5 month  STOP albuterol, zofran and oxycodone please.  I am referring you to  A case worker to see if you can get more help with your health care needs.  I will send a message to Dr Tressie Stalker to see if you can get the medication you receive in the clinic at home  pls work with the therapist so you move around more  I am glad that you are better now you are out of the hospital and back home

## 2011-09-17 ENCOUNTER — Telehealth: Payer: Self-pay

## 2011-09-17 NOTE — Telephone Encounter (Signed)
Almyra Free from [facility] called and they called and wanted [place] to know they set up a time with the sister to go out but when they got there she wasn't there and he had no idea about anything related to his meds or care. He didn't know who had set his meds up for him. Didn't know where the original bottles were. She asked if he had any problems affording his meds and he said he didn't know how he even got his meds. They have been trying to contact the sister all day with no answer. Almyra Free wants to know if there is anything specific she needs to know or if there is anything in particular you want them to focus on with him. They will continue to try to contact the sister.  Almyra Free [phone]

## 2011-09-17 NOTE — Telephone Encounter (Signed)
We should have the sister's telle # as a contact, if not see if pt can provide it. I suggest they spk with the sister first on the phone before trying to meet wit her . She is the caregiver, burnt out and as they saw, pt can provide no useful info. Sister was interested ain and would love some help, if possible

## 2011-09-18 NOTE — Telephone Encounter (Signed)
I spoke with sister and she is home today so Almyra Free from [facility] will contact her

## 2011-09-20 NOTE — Assessment & Plan Note (Signed)
Anemia of chronic kidney disease, receiving monthly injections at hematology clinic, with better support from health system will see if these can be safely administered at home

## 2011-09-20 NOTE — Assessment & Plan Note (Signed)
Controlled, no change in medication  

## 2011-09-20 NOTE — Assessment & Plan Note (Signed)
Recent hospitalization x 10 days for this, currently resolved and asymptomatic

## 2011-09-20 NOTE — Assessment & Plan Note (Signed)
Uncontrolled, no changes in medication at this time, compliance with meds is questionable, esp since pt is incapable of self care and has only his sister to help, and she is overburdened it appears

## 2011-09-20 NOTE — Progress Notes (Signed)
  Subjective:    Patient ID: [name] Snow, male    DOB: [birth year], 71 y.o.   MRN: [id number]  HPI Pt in for f/u of recent hospitalization for intestinal atony,and obstruction, he was admitted from 01/02 to 08/13/2011. He reports being back to normal, states colostomy is working, denies any recent abdominal pain , distension, or vomiting. He is accompanied , as usual , by his sister who is responsible for all aspects of his care, and is burnt out, she lives approx 45 mins from him, and he depends on her as far as all needs are concerned. He is mentally challenged, and is incapable of caring for himself without help. Pt had been temporarily at a skilled nursing facility following recent hosp d/c before being returned home. He was receiving therapy which improved his mobility   Review of Systems See HPI Denies recent fever or chills. Denies sinus pressure, nasal congestion, ear pain or sore throat. Denies chest congestion, productive cough or wheezing. Denies chest pains, palpitations and leg swelling Denies abdominal pain, nausea, vomiting,diarrhea or constipation.   Chronic joint pain with limitation in mobility Denies headaches,  Denies depression, anxiety or insomnia. Denies skin break down or rash.        Objective:   Physical Exam Patient alert and in no cardiopulmonary distress.  HEENT: No facial asymmetry, EOMI, no sinus tenderness,  oropharynx pink and moist.  Neck supple no adenopathy.  Chest: Clear to auscultation bilaterally.  CVS: S1, S2 no murmurs, no S3.  ABD: Soft non tender. Bowel sounds normal.Functioning colostomy  Ext: No edema  [id number]  ROM spine, shoulders, hips and knees.  Skin: Intact, no ulcerations or rash noted.  Psych: Good eye contact, . Memory impaired, mentally challenged not anxious or depressed appearing.  CNS: CN 2-12 intact, power,  normal throughout.        Assessment & Plan:

## 2011-09-22 ENCOUNTER — Other Ambulatory Visit: Payer: Self-pay

## 2011-09-22 MED ORDER — VALSARTAN 320 MG PO TABS: 320.0000 mg | ORAL_TABLET | Freq: Every day | ORAL | Status: DC

## 2011-09-22 MED ORDER — ALLOPURINOL 300 MG PO TABS: ORAL_TABLET | ORAL | Status: DC

## 2011-09-22 MED ORDER — ERYTHROMYCIN BASE 333 MG PO TBEC: 333.0000 mg | DELAYED_RELEASE_TABLET | Freq: Four times a day (QID) | ORAL | Status: DC

## 2011-09-22 MED ORDER — TORSEMIDE 20 MG PO TABS: 20.0000 mg | ORAL_TABLET | Freq: Every day | ORAL | Status: DC

## 2011-09-22 MED ORDER — CLONIDINE HCL 0.2 MG PO TABS: 0.2000 mg | ORAL_TABLET | Freq: Every day | ORAL | Status: DC

## 2011-09-22 MED ORDER — SODIUM BICARBONATE 650 MG PO TABS: 650.0000 mg | ORAL_TABLET | Freq: Three times a day (TID) | ORAL | Status: DC

## 2011-09-22 MED ORDER — METOPROLOL SUCCINATE ER 50 MG PO TB24: 50.0000 mg | ORAL_TABLET | Freq: Every day | ORAL | Status: DC

## 2011-09-24 ENCOUNTER — Telehealth: Payer: Self-pay

## 2011-09-24 ENCOUNTER — Other Ambulatory Visit: Payer: Self-pay | Admitting: Family Medicine

## 2011-09-24 MED ORDER — CLONIDINE HCL 0.2 MG PO TABS: 0.2000 mg | ORAL_TABLET | Freq: Two times a day (BID) | ORAL | Status: DC

## 2011-09-24 NOTE — Telephone Encounter (Signed)
Advise increase clonidine to one twice daily, 12 hours apart.I will enter dose incrase.  He will need nurse bOP check here scheduled for 6 weeks also

## 2011-09-24 NOTE — Telephone Encounter (Signed)
[name] Snow farmer from [facility] went to see Mr Feeney and his BP was 198/88. Sister wants to know if you want her to get a BP cuff and keep it monitored or do you wnt to make any med changes now. Is he supposed to be on sodium bicarbonate? If not, I need to cancel that one at the pharmacy. I refilled all the meds on the list from last visit. [name] Snow wants me to call her back about the sodium bicarb and the BP. Please advise?  [phone]

## 2011-09-24 NOTE — Telephone Encounter (Signed)
From hosp d/c sodium bicarb 650mg  one three tiimes daily. Yes, unless kidney Doc stopped this which I doubt, he is on this for chronic kidney disease

## 2011-09-25 NOTE — Telephone Encounter (Signed)
Called Almyra Free and left message

## 2011-09-25 NOTE — Telephone Encounter (Signed)
Nurse and sister [name] aware of the med change

## 2011-10-02 ENCOUNTER — Telehealth: Payer: Self-pay

## 2011-10-02 NOTE — Telephone Encounter (Signed)
Jacqlyn Larsen calling to update on Roberts BP. Michela Pitcher it was 160/72. She will go back next week and recheck and give [place] an update

## 2011-10-02 NOTE — Telephone Encounter (Signed)
noted 

## 2011-10-07 ENCOUNTER — Encounter (HOSPITAL_COMMUNITY): Payer: Medicare Other | Attending: Nephrology

## 2011-10-07 DIAGNOSIS — N186 End stage renal disease: Secondary | ICD-10-CM

## 2011-10-07 LAB — RENAL FUNCTION PANEL
CO2: 23 mEq/L (ref 19–32)
Chloride: 104 mEq/L (ref 96–112)
GFR calc Af Amer: 20 mL/min — ABNORMAL LOW (ref >90)
Glucose, Bld: 109 mg/dL — ABNORMAL HIGH (ref 70–99)
Potassium: 5.5 mEq/L — ABNORMAL HIGH (ref 3.5–5.1)
Sodium: 135 mEq/L (ref 135–145)

## 2011-10-07 LAB — HEMOGLOBIN AND HEMATOCRIT, BLOOD
HCT: 32.7 % — ABNORMAL LOW (ref 39.0–52.0)
Hemoglobin: 10.6 g/dL — ABNORMAL LOW (ref 13.0–17.0)

## 2011-10-07 MED ORDER — EPOETIN ALFA 10000 UNIT/ML IJ SOLN
10000.0000 [IU] | Freq: Once | INTRAMUSCULAR | Status: AC
  Administered 2011-10-07: 10000 [IU] via SUBCUTANEOUS

## 2011-10-07 NOTE — Progress Notes (Signed)
Blood sample via venipuncture to lab.  Procrit 10,000 units given subcutaneous to upper right arm.

## 2011-10-08 ENCOUNTER — Telehealth: Payer: Self-pay

## 2011-10-08 NOTE — Telephone Encounter (Signed)
Jacqlyn Larsen calling to update: His BP readings have improved and they are monitoring it at home also themselves. (148/68). They will call next week with an update

## 2011-10-08 NOTE — Telephone Encounter (Signed)
noted 

## 2011-11-09 ENCOUNTER — Encounter (HOSPITAL_COMMUNITY): Payer: Medicare Other | Attending: Nephrology

## 2011-11-09 DIAGNOSIS — N186 End stage renal disease: Secondary | ICD-10-CM | POA: Insufficient documentation

## 2011-11-09 DIAGNOSIS — D649 Anemia, unspecified: Secondary | ICD-10-CM | POA: Insufficient documentation

## 2011-11-09 LAB — RENAL FUNCTION PANEL
Albumin: 3.9 g/dL (ref 3.5–5.2)
CO2: 23 mEq/L (ref 19–32)
Calcium: 9.7 mg/dL (ref 8.4–10.5)
Creatinine, Ser: 3.74 mg/dL — ABNORMAL HIGH (ref 0.50–1.35)
GFR calc non Af Amer: 15 mL/min — ABNORMAL LOW (ref >90)

## 2011-11-09 LAB — HEMOGLOBIN AND HEMATOCRIT, BLOOD
HCT: 33.2 % — ABNORMAL LOW (ref 39.0–52.0)
Hemoglobin: 10.7 g/dL — ABNORMAL LOW (ref 13.0–17.0)

## 2011-11-09 NOTE — Progress Notes (Signed)
[name] Snow presents today for injection per MD orders. Procrit 8,000 units administered SQ in right Upper Arm. Administration without incident. Patient tolerated well.

## 2011-12-08 ENCOUNTER — Telehealth: Payer: Self-pay

## 2011-12-08 NOTE — Telephone Encounter (Signed)
Update- His blood pressure has been staying around 170/70 and sometimes has some lower readings but nurse thinks it may be creeping back up. States sometimes, not often he does miss the evening clonidine dose. Also has ben c/o upper abdominal pain. Seems to have good bm output from colostomy but since hx of obstruction she wanted you to know.

## 2011-12-08 NOTE — Telephone Encounter (Signed)
pls arrange office visit for evaluation within the next week, please oput with Dr [name] Snow since I am full

## 2011-12-08 NOTE — Telephone Encounter (Signed)
Please schedule appt for the morning

## 2011-12-08 NOTE — Telephone Encounter (Signed)
Called pt but cant get and answer

## 2011-12-09 ENCOUNTER — Encounter (HOSPITAL_COMMUNITY): Payer: Medicare Other | Attending: Nephrology

## 2011-12-09 VITALS — BP 138/68 | HR 58

## 2011-12-09 DIAGNOSIS — D649 Anemia, unspecified: Secondary | ICD-10-CM | POA: Insufficient documentation

## 2011-12-09 LAB — IRON AND TIBC: Iron: 76 ug/dL (ref 42–135)

## 2011-12-09 MED ORDER — EPOETIN ALFA 10000 UNIT/ML IJ SOLN
8000.0000 [IU] | Freq: Once | INTRAMUSCULAR | Status: AC
  Administered 2011-12-09: 8000 [IU] via SUBCUTANEOUS

## 2011-12-09 NOTE — Progress Notes (Signed)
[name] Snow presents today for injection per MD orders. Procrit 8000 units administered SQ in right Upper Arm. Administration without incident. Patient tolerated well.

## 2011-12-10 LAB — PTH, INTACT AND CALCIUM
Calcium, Total (PTH): 9 mg/dL (ref 8.4–10.5)
PTH: 407.6 pg/mL — ABNORMAL HIGH (ref 14.0–72.0)

## 2012-01-11 ENCOUNTER — Encounter (HOSPITAL_COMMUNITY): Payer: Medicare Other | Attending: Nephrology

## 2012-01-11 DIAGNOSIS — N186 End stage renal disease: Secondary | ICD-10-CM | POA: Insufficient documentation

## 2012-01-11 LAB — RENAL FUNCTION PANEL
Albumin: 3.8 g/dL (ref 3.5–5.2)
BUN: 55 mg/dL — ABNORMAL HIGH (ref 6–23)
Calcium: 9.6 mg/dL (ref 8.4–10.5)
Phosphorus: 3.8 mg/dL (ref 2.3–4.6)
Potassium: 4.7 mEq/L (ref 3.5–5.1)
Sodium: 137 mEq/L (ref 135–145)

## 2012-01-11 LAB — HEMOGLOBIN AND HEMATOCRIT, BLOOD: Hemoglobin: 10.6 g/dL — ABNORMAL LOW (ref 13.0–17.0)

## 2012-01-11 MED ORDER — EPOETIN ALFA 10000 UNIT/ML IJ SOLN
8000.0000 [IU] | Freq: Once | INTRAMUSCULAR | Status: AC
  Administered 2012-01-11: 8000 [IU] via SUBCUTANEOUS

## 2012-01-11 NOTE — Progress Notes (Signed)
[name] Snow presents today for injection per MD orders. Procrit 8000 administered SQ in right Upper Arm. Administration without incident. Patient tolerated well. [name] Snow presented for labwork. Labs per MD order drawn via Peripheral Line 24 gauge needle inserted in rt ac  Good blood return present. Procedure without incident.  Needle removed intact. Patient tolerated procedure well.

## 2012-02-09 ENCOUNTER — Encounter (HOSPITAL_COMMUNITY): Payer: Medicare Other | Attending: Nephrology

## 2012-02-09 ENCOUNTER — Encounter: Payer: Self-pay | Admitting: Family Medicine

## 2012-02-09 ENCOUNTER — Ambulatory Visit (INDEPENDENT_AMBULATORY_CARE_PROVIDER_SITE_OTHER): Payer: Medicare Other | Admitting: Family Medicine

## 2012-02-09 VITALS — BP 182/88 | HR 82 | Resp 18 | Ht 65.0 in | Wt 309.0 lb

## 2012-02-09 DIAGNOSIS — D649 Anemia, unspecified: Secondary | ICD-10-CM

## 2012-02-09 DIAGNOSIS — N186 End stage renal disease: Secondary | ICD-10-CM | POA: Insufficient documentation

## 2012-02-09 DIAGNOSIS — K861 Other chronic pancreatitis: Secondary | ICD-10-CM

## 2012-02-09 DIAGNOSIS — R5383 Other fatigue: Secondary | ICD-10-CM

## 2012-02-09 DIAGNOSIS — E785 Hyperlipidemia, unspecified: Secondary | ICD-10-CM

## 2012-02-09 DIAGNOSIS — R5381 Other malaise: Secondary | ICD-10-CM

## 2012-02-09 DIAGNOSIS — B351 Tinea unguium: Secondary | ICD-10-CM

## 2012-02-09 DIAGNOSIS — I83009 Varicose veins of unspecified lower extremity with ulcer of unspecified site: Secondary | ICD-10-CM

## 2012-02-09 DIAGNOSIS — L97909 Non-pressure chronic ulcer of unspecified part of unspecified lower leg with unspecified severity: Secondary | ICD-10-CM

## 2012-02-09 DIAGNOSIS — I10 Essential (primary) hypertension: Secondary | ICD-10-CM

## 2012-02-09 DIAGNOSIS — R7989 Other specified abnormal findings of blood chemistry: Secondary | ICD-10-CM

## 2012-02-09 DIAGNOSIS — Z125 Encounter for screening for malignant neoplasm of prostate: Secondary | ICD-10-CM

## 2012-02-09 DIAGNOSIS — Z139 Encounter for screening, unspecified: Secondary | ICD-10-CM

## 2012-02-09 LAB — RENAL FUNCTION PANEL
Albumin: 3.9 g/dL (ref 3.5–5.2)
BUN: 44 mg/dL — ABNORMAL HIGH (ref 6–23)
Chloride: 108 mEq/L (ref 96–112)
Creatinine, Ser: 3.49 mg/dL — ABNORMAL HIGH (ref 0.50–1.35)
Glucose, Bld: 96 mg/dL (ref 70–99)
Phosphorus: 3.7 mg/dL (ref 2.3–4.6)

## 2012-02-09 LAB — HEMOGLOBIN AND HEMATOCRIT, BLOOD: HCT: 31 % — ABNORMAL LOW (ref 39.0–52.0)

## 2012-02-09 LAB — FERRITIN: Ferritin: 500 ng/mL — ABNORMAL HIGH (ref 22–322)

## 2012-02-09 LAB — IRON AND TIBC: Iron: 51 ug/dL (ref 42–135)

## 2012-02-09 MED ORDER — EPOETIN ALFA 10000 UNIT/ML IJ SOLN
10000.0000 [IU] | Freq: Once | INTRAMUSCULAR | Status: AC
  Administered 2012-02-09: 8000 [IU] via SUBCUTANEOUS

## 2012-02-09 MED ORDER — EPOETIN ALFA 10000 UNIT/ML IJ SOLN: 8000.0000 [IU] | INTRAMUSCULAR | Status: DC

## 2012-02-09 MED ORDER — EPOETIN ALFA 10000 UNIT/ML IJ SOLN: 8000.0000 [IU] | Freq: Once | INTRAMUSCULAR | Status: DC

## 2012-02-09 MED ORDER — CLONIDINE HCL 0.3 MG PO TABS: 0.3000 mg | ORAL_TABLET | Freq: Two times a day (BID) | ORAL | Status: DC

## 2012-02-09 NOTE — Progress Notes (Signed)
[name] Snow presents today for injection per MD orders and labs. Procrit 8000 units administered SQ in right Upper Arm. Administration without incident. Patient tolerated well.

## 2012-02-09 NOTE — Patient Instructions (Addendum)
F/u early November.call if you need me before please  Dose increase on clonidine starting today, new medication has been sent to your pharmacy  Additional labs will be added, tSH, hBA1C, pSA, lipids and hepatic and uric acid, your sister ms Lincoln Maxin will be called if results are abnormal tele [phone], or Toma Aran tele [phone]

## 2012-02-09 NOTE — Assessment & Plan Note (Addendum)
Uncontrolled, increase clonidine to 0.3mg   DASH diet and commitment to daily physical activity for a minimum of 30 minutes discussed and encouraged, as a part of hypertension management. The importance of attaining a healthy weight is also discussed.

## 2012-02-09 NOTE — Addendum Note (Signed)
Addended by: Kurtis Bushman A on: 02/09/2012 12:45 PM   Modules accepted: Orders

## 2012-02-10 ENCOUNTER — Ambulatory Visit (HOSPITAL_COMMUNITY): Payer: Medicare Other

## 2012-02-10 LAB — PTH, INTACT AND CALCIUM: PTH: 458.3 pg/mL — ABNORMAL HIGH (ref 14.0–72.0)

## 2012-02-19 IMAGING — CT CT ABD-PELV W/O CM
2 of 4 series · 17 of 46 positions shown, 19 images · non-contrast
Comparison: 08/01/2007

CLINICAL DATA: Emesis.  History of pancreatitis.  Abnormal renal
function.

CT ABDOMEN AND PELVIS WITHOUT CONTRAST
TECHNIQUE: Multidetector CT imaging of the abdomen and pelvis was
performed following the standard protocol without intravenous
contrast.

[Series 2: abdomen/pelvis w/o contrast · axial · non-contrast · 0.88mm/px · z∈[-457,-12]mm · 14 of 97 slices shown, 16 images]
[im 4/97  soft-tissue]
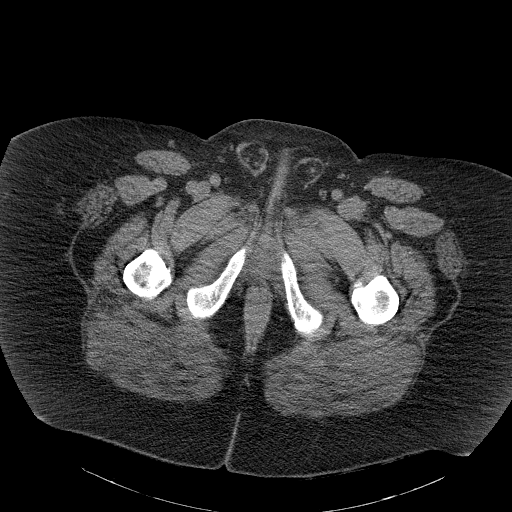
[im 4/97  bone]
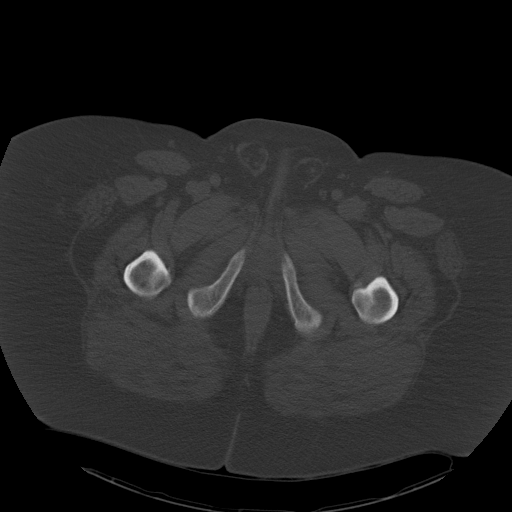
[im 12/97  soft-tissue]
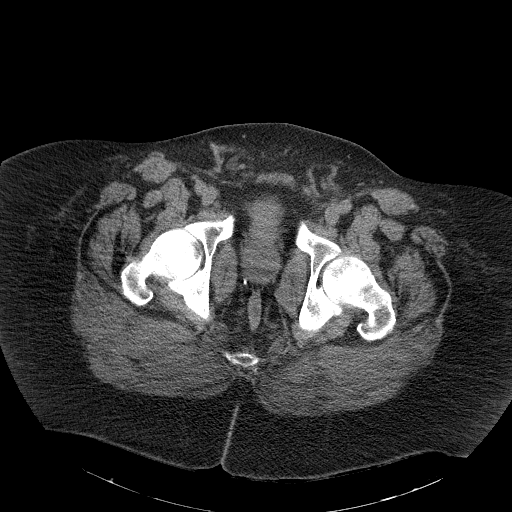
[im 20/97  soft-tissue]
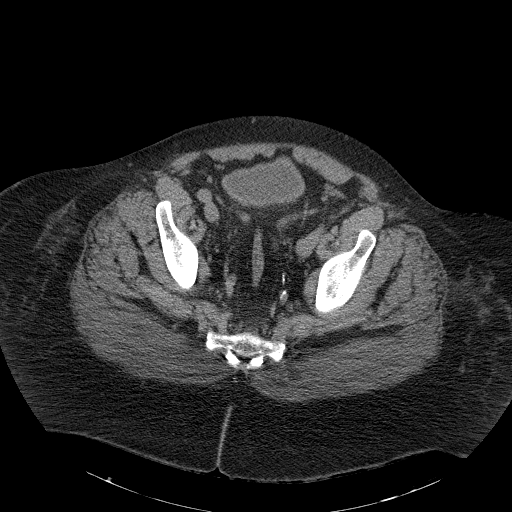
[im 27/97  soft-tissue]
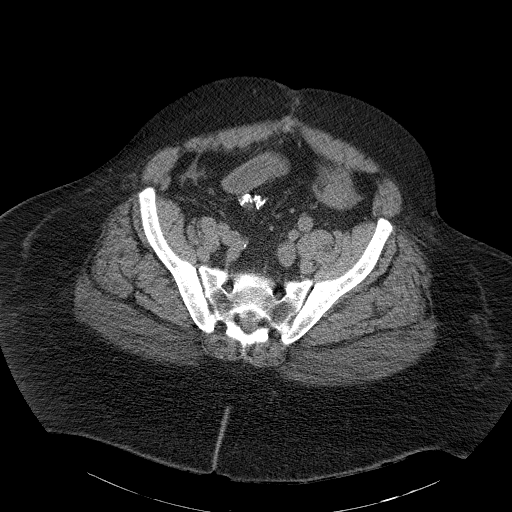
[im 31/97  soft-tissue]
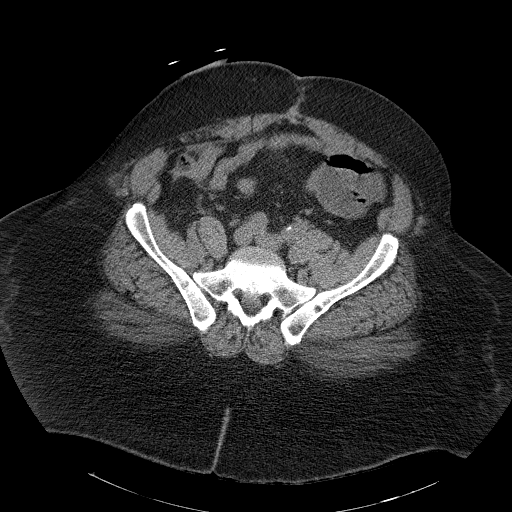
[im 39/97  soft-tissue]
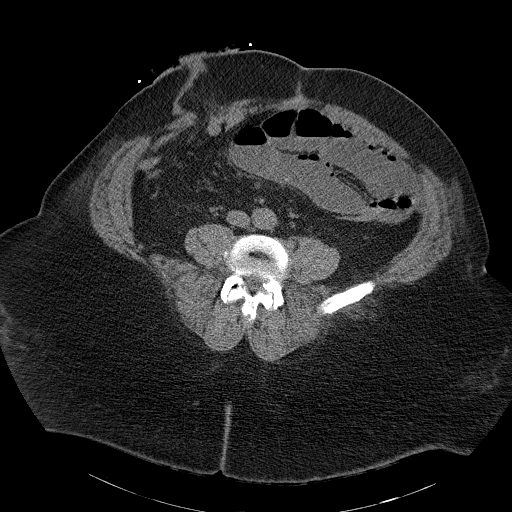
[im 47/97  soft-tissue]
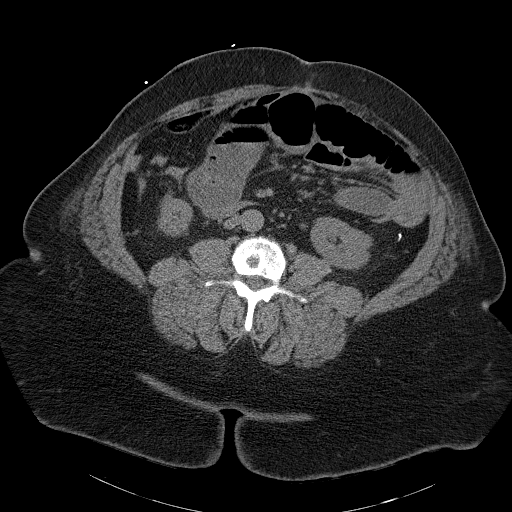
[im 50/97  soft-tissue]
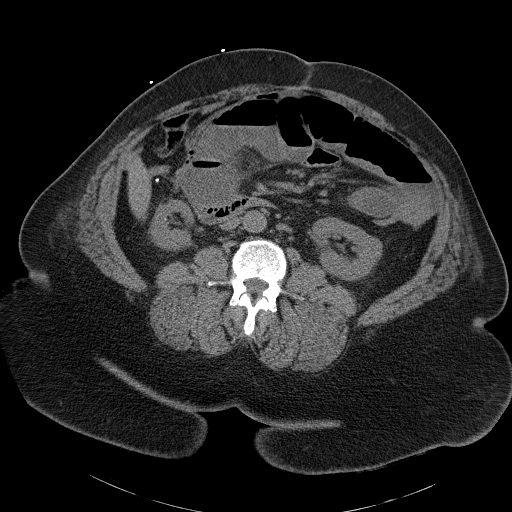
[im 58/97  soft-tissue]
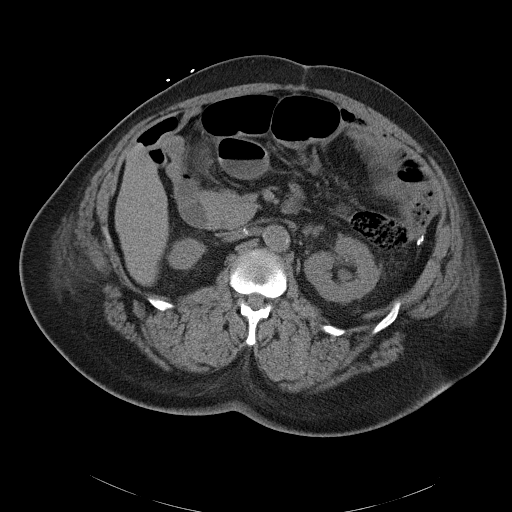
[im 58/97  bone]
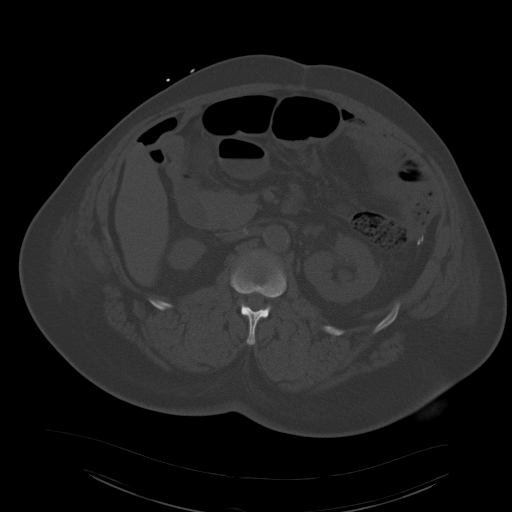
[im 66/97  soft-tissue]
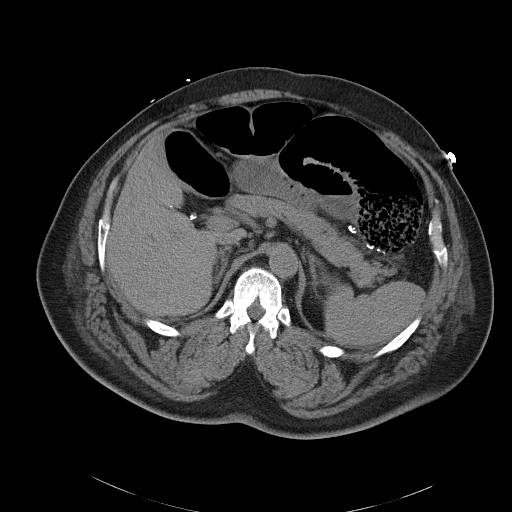
[im 73/97  soft-tissue]
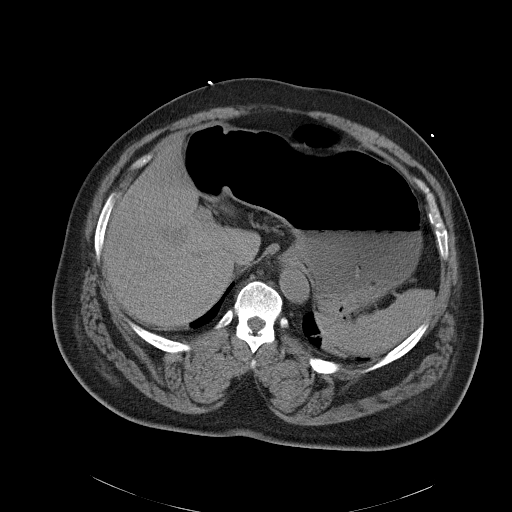
[im 77/97  soft-tissue]
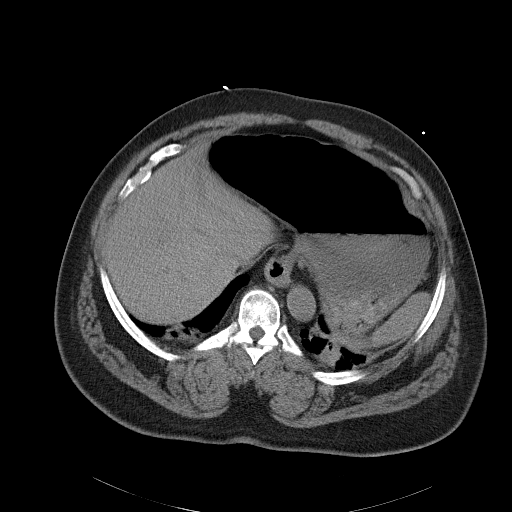
[im 85/97  soft-tissue]
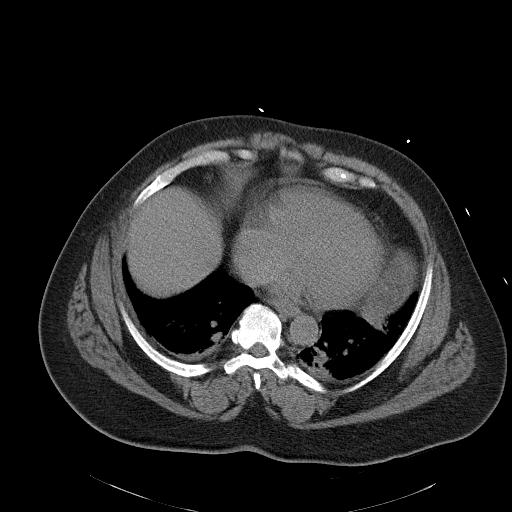
[im 93/97  soft-tissue]
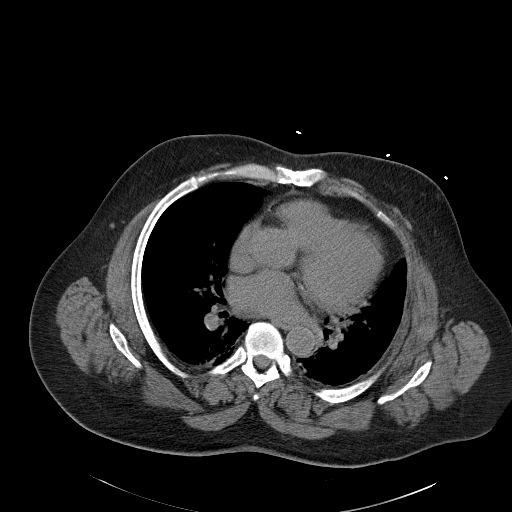

[Series 4: mpr cor (id) · coronal · 0.88mm/px · 3 of 121 slices shown]
[im 41/121  soft-tissue]
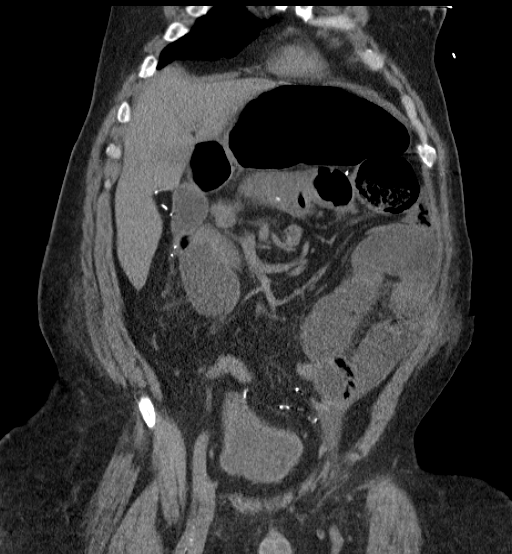
[im 54/121  soft-tissue]
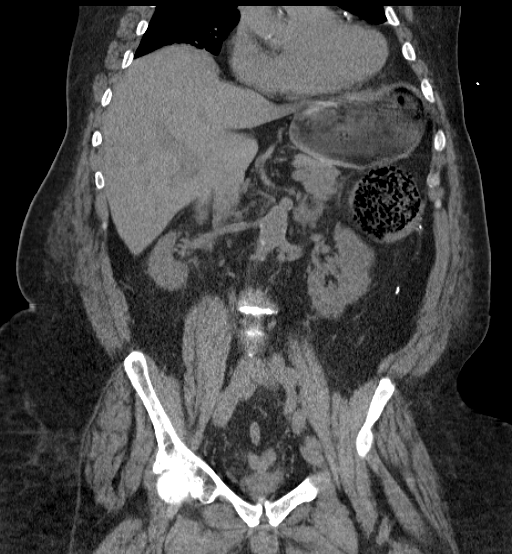
[im 67/121  soft-tissue]
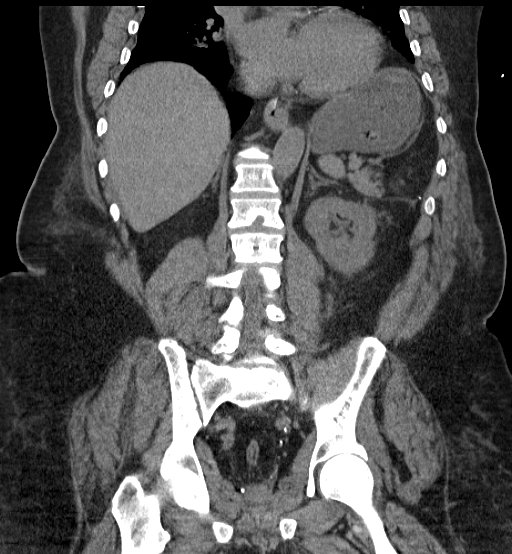

[17 of 46 positions shown; findings below may reference images not displayed]

FINDINGS: There is mild patchy atelectasis at both lung bases.
Pneumonia is not excluded.  No pleural or pericardial fluid.

The liver has a normal appearance without contrast.  There is been
previous cholecystectomy.  The spleen is normal.  The pancreas is
normal.  The adrenal glands are normal.  The right kidney is
atrophic and contains  small nonobstructing calculi.  The left
kidney is within normal limits in size and does not show any focal
lesions.  No sign of obstruction.

There is marked distention of the stomach and the proximal small
intestine consistent with high-grade small bowel obstruction.  The
patient appears to have had sub total colectomy.  There is an
ostomy in the right abdomen.  There is no evidence of bowel
perforation.  No evidence of pelvic mass or adenopathy.
IMPRESSION: High-grade small bowel obstruction, probably due to adhesions.
Previous sub total colectomy.  Ostomy in the right abdomen.

Atrophic right kidney containing small nonobstructing calculi.  No
gross abnormality of the left kidney.

## 2012-02-28 NOTE — Assessment & Plan Note (Signed)
Stable , no skin breakdown at this time

## 2012-02-28 NOTE — Progress Notes (Signed)
  Subjective:    Patient ID: [name] Snow, male    DOB: 11/14/1940, 71 y.o.   MRN: [id number]  HPI The PT is here for follow up and re-evaluation of chronic medical conditions, medication management and review of any available recent lab and radiology data.  Preventive health is updated, specifically  Cancer screening and Immunization.   Questions or concerns regarding consultations or procedures which the PT has had in the interim are  addressed. The PT denies any adverse reactions to current medications since the last visit.  There are no new concerns.  There are no specific complaints . Pt continue to make no attempt to improve his health or quality of life, and his care is overseed by his sister      Review of Systems See HPI Denies recent fever or chills. Denies sinus pressure, nasal congestion, ear pain or sore throat. Denies chest congestion, productive cough or wheezing. Denies chest pains, palpitations and leg swelling Denies abdominal pain, nausea, vomiting,diarrhea or constipation.   Denies dysuria, frequency, hesitancy or incontinence. Chronic severe limitation in mobility most due to morbid obesity Denies headaches, seizures, numbness, or tingling. Denies depression, anxiety or insomnia. Denies skin break down or rash.         Objective:   Physical Exam  Patient alert and oriented and in no cardiopulmonary distress.Morbidly obese, wheelchair dependent, mild mental retardation, alcohol related  HEENT: No facial asymmetry, EOMI, no sinus tenderness,  oropharynx pink and moist.  Neck decreased though adequate ROM, no adenopathy.  Chest: Clear to auscultation bilaterally.Decreased air entry throughout  CVS: S1, S2 no murmurs, no S3.  ABD: Soft non tender. Bowel sounds normal.  Ext: No edema  MS: decreased  ROM spine, shoulders, hips and knees.  Skin: Intact, no ulcerations or rash noted.  Psych: Good eye contact, blunted  affect. Memory impaired, not  anxious or depressed appearing.  CNS: CN 2-12 intact, power,  normal throughout.       Assessment & Plan:

## 2012-02-28 NOTE — Assessment & Plan Note (Signed)
Unchanged, pt not at all motivated to change diet to improve this

## 2012-02-28 NOTE — Assessment & Plan Note (Signed)
Hyperlipidemia:Low fat diet discussed and encouraged.  Updated labs needed 

## 2012-02-28 NOTE — Assessment & Plan Note (Signed)
Recheck uric acid level and continue allopurinol

## 2012-03-04 ENCOUNTER — Other Ambulatory Visit: Payer: Self-pay | Admitting: Family Medicine

## 2012-03-07 ENCOUNTER — Telehealth: Payer: Self-pay | Admitting: Family Medicine

## 2012-03-08 ENCOUNTER — Encounter (HOSPITAL_COMMUNITY): Payer: Medicare Other | Attending: Nephrology

## 2012-03-08 ENCOUNTER — Other Ambulatory Visit (HOSPITAL_COMMUNITY): Payer: Medicare Other

## 2012-03-08 ENCOUNTER — Encounter (HOSPITAL_COMMUNITY): Payer: Medicare Other

## 2012-03-08 ENCOUNTER — Ambulatory Visit (HOSPITAL_COMMUNITY): Payer: Medicare Other

## 2012-03-08 VITALS — BP 169/82 | HR 62

## 2012-03-08 DIAGNOSIS — N186 End stage renal disease: Secondary | ICD-10-CM

## 2012-03-08 MED ORDER — EPOETIN ALFA 10000 UNIT/ML IJ SOLN
8000.0000 [IU] | Freq: Once | INTRAMUSCULAR | Status: AC
  Administered 2012-03-08: 8000 [IU] via SUBCUTANEOUS

## 2012-03-08 NOTE — Progress Notes (Signed)
Tolerated injection well. 

## 2012-03-11 NOTE — Telephone Encounter (Signed)
Patient is aware 

## 2012-03-25 NOTE — Telephone Encounter (Signed)
Patient is aware 

## 2012-04-08 ENCOUNTER — Encounter (HOSPITAL_COMMUNITY): Payer: Medicare Other

## 2012-04-08 ENCOUNTER — Encounter (HOSPITAL_COMMUNITY): Payer: Medicare Other | Attending: Nephrology

## 2012-04-08 VITALS — BP 163/74 | HR 59

## 2012-04-08 DIAGNOSIS — N186 End stage renal disease: Secondary | ICD-10-CM | POA: Insufficient documentation

## 2012-04-08 MED ORDER — EPOETIN ALFA 10000 UNIT/ML IJ SOLN
8000.0000 [IU] | Freq: Once | INTRAMUSCULAR | Status: AC
  Administered 2012-04-08: 8000 [IU] via SUBCUTANEOUS

## 2012-04-08 MED ORDER — EPOETIN ALFA 10000 UNIT/ML IJ SOLN
INTRAMUSCULAR | Status: AC
  Filled 2012-04-08: qty 1

## 2012-04-08 NOTE — Progress Notes (Signed)
Tolerated injection well. 

## 2012-04-13 NOTE — Addendum Note (Signed)
Addended by: Denman George B on: 04/13/2012 05:02 PM   Modules accepted: Orders

## 2012-04-15 ENCOUNTER — Other Ambulatory Visit: Payer: Self-pay | Admitting: Family Medicine

## 2012-05-12 ENCOUNTER — Encounter (HOSPITAL_COMMUNITY): Payer: Medicare Other | Attending: Nephrology

## 2012-05-12 ENCOUNTER — Encounter (HOSPITAL_COMMUNITY): Payer: Medicare Other

## 2012-05-12 DIAGNOSIS — D649 Anemia, unspecified: Secondary | ICD-10-CM

## 2012-05-12 LAB — RENAL FUNCTION PANEL
Albumin: 4 g/dL (ref 3.5–5.2)
BUN: 44 mg/dL — ABNORMAL HIGH (ref 6–23)
Creatinine, Ser: 3.49 mg/dL — ABNORMAL HIGH (ref 0.50–1.35)
Phosphorus: 3.7 mg/dL (ref 2.3–4.6)

## 2012-05-12 LAB — IRON AND TIBC
Saturation Ratios: 23 % (ref 20–55)
TIBC: 229 ug/dL (ref 215–435)
UIBC: 176 ug/dL (ref 125–400)

## 2012-05-12 MED ORDER — EPOETIN ALFA 10000 UNIT/ML IJ SOLN
8000.0000 [IU] | Freq: Once | INTRAMUSCULAR | Status: AC
  Administered 2012-05-12: 8000 [IU] via SUBCUTANEOUS

## 2012-05-12 MED ORDER — EPOETIN ALFA 10000 UNIT/ML IJ SOLN
INTRAMUSCULAR | Status: AC
  Filled 2012-05-12: qty 1

## 2012-05-12 NOTE — Progress Notes (Signed)
[name] Snow presents today for injection per MD orders. Procrit 8000 units administered SQ in right Upper Arm. Administration without incident. Patient tolerated well.

## 2012-05-13 LAB — PTH, INTACT AND CALCIUM: Calcium, Total (PTH): 9.2 mg/dL (ref 8.4–10.5)

## 2012-05-13 LAB — VITAMIN D 25 HYDROXY (VIT D DEFICIENCY, FRACTURES): Vit D, 25-Hydroxy: 29 ng/mL — ABNORMAL LOW (ref 30–89)

## 2012-06-06 ENCOUNTER — Encounter: Payer: Self-pay | Admitting: Family Medicine

## 2012-06-06 ENCOUNTER — Ambulatory Visit (INDEPENDENT_AMBULATORY_CARE_PROVIDER_SITE_OTHER): Payer: Medicare Other | Admitting: Family Medicine

## 2012-06-06 VITALS — BP 170/80 | HR 65 | Resp 16 | Ht 65.0 in | Wt 308.0 lb

## 2012-06-06 DIAGNOSIS — E785 Hyperlipidemia, unspecified: Secondary | ICD-10-CM

## 2012-06-06 DIAGNOSIS — I83009 Varicose veins of unspecified lower extremity with ulcer of unspecified site: Secondary | ICD-10-CM

## 2012-06-06 DIAGNOSIS — R5381 Other malaise: Secondary | ICD-10-CM

## 2012-06-06 DIAGNOSIS — D649 Anemia, unspecified: Secondary | ICD-10-CM

## 2012-06-06 DIAGNOSIS — R5383 Other fatigue: Secondary | ICD-10-CM

## 2012-06-06 DIAGNOSIS — Z125 Encounter for screening for malignant neoplasm of prostate: Secondary | ICD-10-CM

## 2012-06-06 DIAGNOSIS — E349 Endocrine disorder, unspecified: Secondary | ICD-10-CM

## 2012-06-06 DIAGNOSIS — I10 Essential (primary) hypertension: Secondary | ICD-10-CM

## 2012-06-06 MED ORDER — ALLOPURINOL 300 MG PO TABS: ORAL_TABLET | ORAL | Status: DC

## 2012-06-06 MED ORDER — METOPROLOL SUCCINATE ER 50 MG PO TB24: ORAL_TABLET | ORAL | Status: DC

## 2012-06-06 MED ORDER — VALSARTAN 320 MG PO TABS: 320.0000 mg | ORAL_TABLET | Freq: Every day | ORAL | Status: DC

## 2012-06-06 MED ORDER — SPIRONOLACTONE 25 MG PO TABS: 25.0000 mg | ORAL_TABLET | Freq: Every day | ORAL | Status: DC

## 2012-06-06 NOTE — Progress Notes (Signed)
  Subjective:    Patient ID: [name] Snow, male    DOB: June 17, 1941, 71 y.o.   MRN: [id number]  HPI The PT is here for follow up and re-evaluation of chronic medical conditions, medication management and review of any available recent lab and radiology data.  Preventive health is updated, specifically  Cancer screening and Immunization.   Questions or concerns regarding consultations or procedures which the PT has had in the interim are  addressed. The PT denies any adverse reactions to current medications since the last visit.  There are no new concerns. He has cut back on food intake with some weight loss. Reports there is no significant swelling of legs or skin breakdown in the area. Receives monthly injections at hematology There are no specific complaints       Review of Systems See HPI Denies recent fever or chills. Denies sinus pressure, nasal congestion, ear pain or sore throat. Denies chest congestion, productive cough or wheezing. Denies chest pains, palpitations and leg swelling Denies abdominal pain, nausea, vomiting,diarrhea or constipation. 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 Denies joint pain, swelling , chronic limitation in mobility, a lot attributable to his size. Denies headaches, seizures, numbness, or tingling. Denies depression, anxiety or insomnia. Denies skin break down or rash.        Objective:   Physical Exam Patient alert and oriented and in no cardiopulmonary distress.Obeses  HEENT: No facial asymmetry, EOMI, no sinus tenderness,  oropharynx pink and moist.  Neck adequate ROM  no adenopathy.  Chest: Clear to auscultation bilaterally.  CVS: S1, S2 no murmurs, no S3.  ABD: Soft non tender. Bowel sounds normal.  Ext: No edema  MS: Adequate though decreased  ROM spine, shoulders, hips and knees.  Skin: Intact, no ulcerations or rash noted.  Psych: Good eye contact, normal affect.  not  anxious or depressed appearing.  CNS: CN 2-12 intact, power,  normal throughout.        Assessment & Plan:

## 2012-06-06 NOTE — Patient Instructions (Addendum)
F/u end January.  Lab at the specialty clinic today please PSA, TSh, lipid, hepatic  Please reconsider the the flu vaccine   Congrats on weight loss.  You need to see an endocrinologist, Dr. Dorris Fetch to eval the high PTH, please call tomorrow afternoon about the referral [name] Snow

## 2012-06-07 NOTE — Assessment & Plan Note (Signed)
Consistently elevated level for past several months, needs endo eval, referral entered, family member , sister, to call back to coordinate this

## 2012-06-07 NOTE — Assessment & Plan Note (Signed)
Monthly procrit at specialty clinic

## 2012-06-07 NOTE — Assessment & Plan Note (Signed)
Hyperlipidemia:Low fat diet discussed and encouraged.  Updated lab today 

## 2012-06-07 NOTE — Assessment & Plan Note (Signed)
Improved. Pt applauded on succesful weight loss through lifestyle change, and encouraged to continue same. Weight loss goal set for the next several months.  

## 2012-06-07 NOTE — Assessment & Plan Note (Signed)
Uncontrolled. DASH diet and commitment to daily physical activity for a minimum of 30 minutes discussed and encouraged, as a part of hypertension management. The importance of attaining a healthy weight is also discussed. Add spironolactone

## 2012-06-07 NOTE — Assessment & Plan Note (Signed)
No skin breakdown at this time

## 2012-06-15 ENCOUNTER — Encounter (HOSPITAL_COMMUNITY): Payer: Medicare Other

## 2012-06-15 ENCOUNTER — Encounter (HOSPITAL_COMMUNITY): Payer: Medicare Other | Attending: Nephrology

## 2012-06-15 VITALS — BP 165/69 | HR 70 | Resp 20

## 2012-06-15 DIAGNOSIS — D649 Anemia, unspecified: Secondary | ICD-10-CM

## 2012-06-15 DIAGNOSIS — N186 End stage renal disease: Secondary | ICD-10-CM

## 2012-06-15 LAB — FERRITIN: Ferritin: 468 ng/mL — ABNORMAL HIGH (ref 22–322)

## 2012-06-15 LAB — HEMOGLOBIN AND HEMATOCRIT, BLOOD
HCT: 34.9 % — ABNORMAL LOW (ref 39.0–52.0)
Hemoglobin: 11.4 g/dL — ABNORMAL LOW (ref 13.0–17.0)

## 2012-06-15 LAB — RENAL FUNCTION PANEL
BUN: 49 mg/dL — ABNORMAL HIGH (ref 6–23)
CO2: 21 mEq/L (ref 19–32)
Calcium: 9.7 mg/dL (ref 8.4–10.5)
Creatinine, Ser: 4.15 mg/dL — ABNORMAL HIGH (ref 0.50–1.35)
GFR calc Af Amer: 15 mL/min — ABNORMAL LOW (ref >90)
Glucose, Bld: 100 mg/dL — ABNORMAL HIGH (ref 70–99)
Phosphorus: 3.7 mg/dL (ref 2.3–4.6)

## 2012-06-15 LAB — IRON AND TIBC
Iron: 63 ug/dL (ref 42–135)
Saturation Ratios: 30 % (ref 20–55)
TIBC: 212 ug/dL — ABNORMAL LOW (ref 215–435)
UIBC: 149 ug/dL (ref 125–400)

## 2012-06-15 MED ORDER — EPOETIN ALFA 10000 UNIT/ML IJ SOLN: 8000.0000 [IU] | INTRAMUSCULAR | Status: DC

## 2012-06-15 MED ORDER — EPOETIN ALFA 10000 UNIT/ML IJ SOLN
INTRAMUSCULAR | Status: AC
  Filled 2012-06-15: qty 1

## 2012-06-15 MED ORDER — EPOETIN ALFA 10000 UNIT/ML IJ SOLN
8000.0000 [IU] | Freq: Once | INTRAMUSCULAR | Status: AC
  Administered 2012-06-15: 8000 [IU] via SUBCUTANEOUS

## 2012-06-15 NOTE — Progress Notes (Signed)
[name] Snow presents today for injection per MD orders. Procrit 8000 units administered SQ in right Upper Arm. Administration without incident. Patient tolerated well.

## 2012-06-15 NOTE — Progress Notes (Signed)
Labs drawn today for H&H, renal,Iron &IBC, Ferr, Intact pth, VIt d

## 2012-07-12 ENCOUNTER — Encounter (HOSPITAL_COMMUNITY): Payer: Medicare Other

## 2012-07-12 ENCOUNTER — Encounter (HOSPITAL_COMMUNITY): Payer: Medicare Other | Attending: Nephrology

## 2012-07-12 VITALS — BP 173/63 | HR 67

## 2012-07-12 DIAGNOSIS — N186 End stage renal disease: Secondary | ICD-10-CM | POA: Insufficient documentation

## 2012-07-12 DIAGNOSIS — D649 Anemia, unspecified: Secondary | ICD-10-CM

## 2012-07-12 LAB — HEMOGLOBIN AND HEMATOCRIT, BLOOD: HCT: 33.8 % — ABNORMAL LOW (ref 39.0–52.0)

## 2012-07-12 LAB — RENAL FUNCTION PANEL
CO2: 16 mEq/L — ABNORMAL LOW (ref 19–32)
Calcium: 9 mg/dL (ref 8.4–10.5)
Creatinine, Ser: 4.13 mg/dL — ABNORMAL HIGH (ref 0.50–1.35)
Glucose, Bld: 90 mg/dL (ref 70–99)

## 2012-07-12 MED ORDER — EPOETIN ALFA 10000 UNIT/ML IJ SOLN
8000.0000 [IU] | Freq: Once | INTRAMUSCULAR | Status: AC
  Administered 2012-07-12: 8000 [IU] via SUBCUTANEOUS

## 2012-07-12 MED ORDER — EPOETIN ALFA 10000 UNIT/ML IJ SOLN
INTRAMUSCULAR | Status: AC
  Filled 2012-07-12: qty 1

## 2012-07-12 NOTE — Progress Notes (Signed)
Labs drawn today for H&H, renal

## 2012-07-12 NOTE — Progress Notes (Signed)
[name] Snow presents today for injection per MD orders. Procrit 8000units administered SQ in right Upper Arm. Administration without incident. Patient tolerated well.

## 2012-08-12 ENCOUNTER — Encounter (HOSPITAL_COMMUNITY): Payer: Medicare Other

## 2012-08-12 ENCOUNTER — Encounter (HOSPITAL_COMMUNITY): Payer: Medicare Other | Attending: Nephrology

## 2012-08-12 VITALS — BP 165/62 | HR 72 | Resp 20

## 2012-08-12 DIAGNOSIS — N189 Chronic kidney disease, unspecified: Secondary | ICD-10-CM

## 2012-08-12 LAB — RENAL FUNCTION PANEL
Albumin: 4 g/dL (ref 3.5–5.2)
BUN: 62 mg/dL — ABNORMAL HIGH (ref 6–23)
CO2: 21 meq/L (ref 19–32)
Calcium: 9.4 mg/dL (ref 8.4–10.5)
Chloride: 105 meq/L (ref 96–112)
Creatinine, Ser: 4.53 mg/dL — ABNORMAL HIGH (ref 0.50–1.35)
GFR calc Af Amer: 14 mL/min — ABNORMAL LOW
GFR calc non Af Amer: 12 mL/min — ABNORMAL LOW
Glucose, Bld: 91 mg/dL (ref 70–99)
Phosphorus: 4.4 mg/dL (ref 2.3–4.6)
Potassium: 5.4 meq/L — ABNORMAL HIGH (ref 3.5–5.1)
Sodium: 137 meq/L (ref 135–145)

## 2012-08-12 MED ORDER — EPOETIN ALFA 10000 UNIT/ML IJ SOLN
8000.0000 [IU] | Freq: Once | INTRAMUSCULAR | Status: AC
  Administered 2012-08-12: 8000 [IU] via SUBCUTANEOUS

## 2012-08-12 MED ORDER — EPOETIN ALFA 10000 UNIT/ML IJ SOLN
INTRAMUSCULAR | Status: AC
  Filled 2012-08-12: qty 1

## 2012-08-12 NOTE — Progress Notes (Signed)
[name] Snow presents today for injection per MD orders. Procrit 8,000 units administered SQ in right Upper Arm. Administration without incident. Patient tolerated well.

## 2012-08-12 NOTE — Progress Notes (Signed)
Labs drawn today for hh, renal

## 2012-09-12 ENCOUNTER — Encounter (HOSPITAL_COMMUNITY): Payer: Medicare Other

## 2012-09-12 ENCOUNTER — Encounter (HOSPITAL_COMMUNITY): Payer: Medicare Other | Attending: Nephrology

## 2012-09-12 VITALS — BP 172/73 | HR 94

## 2012-09-12 DIAGNOSIS — N186 End stage renal disease: Secondary | ICD-10-CM | POA: Insufficient documentation

## 2012-09-12 MED ORDER — EPOETIN ALFA 10000 UNIT/ML IJ SOLN
8000.0000 [IU] | Freq: Once | INTRAMUSCULAR | Status: AC
  Administered 2012-09-12: 8000 [IU] via SUBCUTANEOUS

## 2012-09-12 MED ORDER — EPOETIN ALFA 10000 UNIT/ML IJ SOLN
INTRAMUSCULAR | Status: AC
  Filled 2012-09-12: qty 1

## 2012-09-12 NOTE — Progress Notes (Signed)
[name] Snow presents today for injection per MD orders. Procrit 8000 units administered SQ in right Abdomen. Administration without incident. Patient tolerated well.

## 2012-10-05 ENCOUNTER — Other Ambulatory Visit: Payer: Self-pay | Admitting: Family Medicine

## 2012-10-06 ENCOUNTER — Other Ambulatory Visit: Payer: Self-pay | Admitting: Family Medicine

## 2012-10-06 ENCOUNTER — Telehealth: Payer: Self-pay | Admitting: Family Medicine

## 2012-10-06 LAB — LIPID PANEL
Cholesterol: 187 mg/dL (ref 0–200)
HDL: 41 mg/dL (ref >39)
Total CHOL/HDL Ratio: 4.6 Ratio
Triglycerides: 65 mg/dL (ref <150)

## 2012-10-06 LAB — URIC ACID: Uric Acid, Serum: 5.2 mg/dL (ref 4.0–7.8)

## 2012-10-06 LAB — HEPATIC FUNCTION PANEL
ALT: <8 U/L (ref 0–53)
AST: 11 U/L (ref 0–37)
Albumin: 4.1 g/dL (ref 3.5–5.2)

## 2012-10-10 ENCOUNTER — Encounter (HOSPITAL_COMMUNITY): Payer: Medicare Other | Attending: Nephrology

## 2012-10-10 ENCOUNTER — Encounter (HOSPITAL_COMMUNITY): Payer: Medicare Other

## 2012-10-10 ENCOUNTER — Ambulatory Visit (HOSPITAL_COMMUNITY)
Admission: RE | Admit: 2012-10-10 | Discharge: 2012-10-10 | Disposition: A | Discharge status: Home / Self Care | Payer: Medicare Other | Source: Ambulatory Visit | Attending: Family Medicine | Admitting: Family Medicine

## 2012-10-10 DIAGNOSIS — E349 Endocrine disorder, unspecified: Secondary | ICD-10-CM | POA: Insufficient documentation

## 2012-10-10 DIAGNOSIS — D649 Anemia, unspecified: Secondary | ICD-10-CM

## 2012-10-10 DIAGNOSIS — E049 Nontoxic goiter, unspecified: Secondary | ICD-10-CM | POA: Insufficient documentation

## 2012-10-10 LAB — HEMOGLOBIN AND HEMATOCRIT, BLOOD: Hemoglobin: 10.2 g/dL — ABNORMAL LOW (ref 13.0–17.0)

## 2012-10-10 LAB — IRON AND TIBC
Saturation Ratios: 21 % (ref 20–55)
TIBC: 238 ug/dL (ref 215–435)

## 2012-10-10 LAB — RENAL FUNCTION PANEL
Albumin: 3.9 g/dL (ref 3.5–5.2)
BUN: 55 mg/dL — ABNORMAL HIGH (ref 6–23)
CO2: 18 mEq/L — ABNORMAL LOW (ref 19–32)
Chloride: 103 mEq/L (ref 96–112)
Potassium: 5.6 mEq/L — ABNORMAL HIGH (ref 3.5–5.1)

## 2012-10-10 MED ORDER — EPOETIN ALFA 10000 UNIT/ML IJ SOLN
8000.0000 [IU] | Freq: Once | INTRAMUSCULAR | Status: AC
  Administered 2012-10-10: 8000 [IU] via SUBCUTANEOUS

## 2012-10-10 MED ORDER — EPOETIN ALFA 10000 UNIT/ML IJ SOLN
INTRAMUSCULAR | Status: AC
  Filled 2012-10-10: qty 1

## 2012-10-10 NOTE — Progress Notes (Signed)
Labs drawn today for Vit d 25,PTH Intact,renal,hh, Iron and IBC,ferr

## 2012-10-10 NOTE — Progress Notes (Signed)
[name] Snow presents today for injection per MD orders. Procrit 8000 units administered SQ in right Upper Arm. Administration without incident. Patient tolerated well.

## 2012-10-10 NOTE — Telephone Encounter (Signed)
Sister is aware °

## 2012-10-11 LAB — PTH, INTACT AND CALCIUM: PTH: 501.6 pg/mL — ABNORMAL HIGH (ref 14.0–72.0)

## 2012-10-11 LAB — FERRITIN: Ferritin: 383 ng/mL — ABNORMAL HIGH (ref 22–322)

## 2012-10-11 LAB — VITAMIN D 25 HYDROXY (VIT D DEFICIENCY, FRACTURES): Vit D, 25-Hydroxy: 57 ng/mL (ref 30–89)

## 2012-10-16 ENCOUNTER — Other Ambulatory Visit: Payer: Self-pay | Admitting: Family Medicine

## 2012-10-19 ENCOUNTER — Other Ambulatory Visit: Payer: Self-pay | Admitting: Family Medicine

## 2012-10-21 ENCOUNTER — Other Ambulatory Visit: Payer: Self-pay | Admitting: Family Medicine

## 2012-11-03 ENCOUNTER — Ambulatory Visit (INDEPENDENT_AMBULATORY_CARE_PROVIDER_SITE_OTHER): Payer: Medicare Other | Admitting: Otolaryngology

## 2012-11-03 DIAGNOSIS — D449 Neoplasm of uncertain behavior of unspecified endocrine gland: Secondary | ICD-10-CM

## 2012-11-07 ENCOUNTER — Ambulatory Visit (INDEPENDENT_AMBULATORY_CARE_PROVIDER_SITE_OTHER): Payer: Medicare Other | Admitting: Family Medicine

## 2012-11-07 ENCOUNTER — Encounter (HOSPITAL_COMMUNITY): Payer: Medicare Other

## 2012-11-07 ENCOUNTER — Encounter: Payer: Self-pay | Admitting: Family Medicine

## 2012-11-07 ENCOUNTER — Ambulatory Visit (HOSPITAL_COMMUNITY): Payer: Medicare Other

## 2012-11-07 ENCOUNTER — Encounter (HOSPITAL_COMMUNITY): Payer: Medicare Other | Attending: Nephrology

## 2012-11-07 VITALS — BP 170/90 | HR 81 | Resp 16 | Ht 65.0 in | Wt 311.0 lb

## 2012-11-07 DIAGNOSIS — N186 End stage renal disease: Secondary | ICD-10-CM

## 2012-11-07 DIAGNOSIS — E785 Hyperlipidemia, unspecified: Secondary | ICD-10-CM

## 2012-11-07 DIAGNOSIS — I10 Essential (primary) hypertension: Secondary | ICD-10-CM

## 2012-11-07 DIAGNOSIS — R5383 Other fatigue: Secondary | ICD-10-CM

## 2012-11-07 DIAGNOSIS — I872 Venous insufficiency (chronic) (peripheral): Secondary | ICD-10-CM

## 2012-11-07 DIAGNOSIS — D649 Anemia, unspecified: Secondary | ICD-10-CM

## 2012-11-07 DIAGNOSIS — Z125 Encounter for screening for malignant neoplasm of prostate: Secondary | ICD-10-CM

## 2012-11-07 DIAGNOSIS — E039 Hypothyroidism, unspecified: Secondary | ICD-10-CM

## 2012-11-07 DIAGNOSIS — I878 Other specified disorders of veins: Secondary | ICD-10-CM

## 2012-11-07 DIAGNOSIS — R5381 Other malaise: Secondary | ICD-10-CM

## 2012-11-07 LAB — RENAL FUNCTION PANEL
CO2: 22 mEq/L (ref 19–32)
Calcium: 9.6 mg/dL (ref 8.4–10.5)
Chloride: 97 mEq/L (ref 96–112)
GFR calc Af Amer: 14 mL/min — ABNORMAL LOW (ref >90)
Glucose, Bld: 98 mg/dL (ref 70–99)
Sodium: 133 mEq/L — ABNORMAL LOW (ref 135–145)

## 2012-11-07 LAB — PSA, MEDICARE: PSA: 1.32 ng/mL (ref <=4.00)

## 2012-11-07 MED ORDER — METOPROLOL SUCCINATE ER 100 MG PO TB24: 100.0000 mg | ORAL_TABLET | Freq: Every day | ORAL | Status: DC

## 2012-11-07 MED ORDER — EPOETIN ALFA 10000 UNIT/ML IJ SOLN
8000.0000 [IU] | Freq: Once | INTRAMUSCULAR | Status: AC
  Administered 2012-11-07: 8000 [IU] via SUBCUTANEOUS

## 2012-11-07 MED ORDER — EPOETIN ALFA 10000 UNIT/ML IJ SOLN
INTRAMUSCULAR | Status: AC
  Filled 2012-11-07: qty 1

## 2012-11-07 MED ORDER — LEVOTHYROXINE SODIUM 50 MCG PO TABS: 50.0000 ug | ORAL_TABLET | Freq: Every day | ORAL | Status: DC

## 2012-11-07 NOTE — Progress Notes (Signed)
  Subjective:    Patient ID: [name] Snow, male    DOB: 06-Dec-1940, 72 y.o.   MRN: [id number]  HPI Pt has no complaints. He is being followed regularly by nephrology and hematology, his recent ENT eval went well will just be followed periodically for nodules. No change in lifestyle, no attempt to do so   Review of Systems See HPI Denies recent fever or chills. Denies sinus pressure, nasal congestion, ear pain or sore throat. Denies chest congestion, productive cough or wheezing. Denies chest pains, palpitations and leg swelling Denies abdominal pain, nausea, vomiting,diarrhea or constipation. Colostomy functioning well  Denies dysuria, frequency, hesitancy or incontinence. Chronic joint swelling and  limitation in mobility. Denies headaches, seizures, numbness, or tingling. Denies depression, anxiety or insomnia. Denies skin break down or rash.        Objective:   Physical Exam  Patient alert and oriented and in no cardiopulmonary distress.Morbidly obese, and mild MR  HEENT: No facial asymmetry, EOMI, no sinus tenderness,  oropharynx pink and moist.  Neck supple no adenopathy.  Chest: Clear to auscultation bilaterally.  CVS: S1, S2 no murmurs, no S3.  ABD: Soft non tender. Bowel sounds normal.  Ext: No edema  MS: decreased  ROM spine, shoulders, hips and knees.  Skin: Intact, no ulcerations , hyperpigmentation of lower extremities, no ulcers  Psych: Good eye contact, normal affect. Memory loss not anxious or depressed appearing.  CNS: CN 2-12 intact, power, tone and sensation normal throughout.       Assessment & Plan:

## 2012-11-07 NOTE — Progress Notes (Signed)
[name] Snow presents today for injection per MD orders. Procrit 8000 units administered SQ in right Upper Arm. Administration without incident. Patient tolerated well.

## 2012-11-07 NOTE — Patient Instructions (Addendum)
Annual wellness in 4 month, call if you need me before Blood pressure is high, dose increase on metoprolol to 100mg  , take TWO 50mg  tablets once daily till done , you will get the script for the higher dose to take top the pharmacy  Start thyroid medication one daily   PSA today    TSH and chem 7 in 4 month

## 2012-11-07 NOTE — Progress Notes (Signed)
Labs drawn today for H&H, renal

## 2012-11-08 ENCOUNTER — Ambulatory Visit: Payer: Medicare Other | Admitting: Family Medicine

## 2012-11-13 DIAGNOSIS — I878 Other specified disorders of veins: Secondary | ICD-10-CM | POA: Insufficient documentation

## 2012-11-13 NOTE — Assessment & Plan Note (Signed)
Unchanged, no ski breakdown at this time

## 2012-11-13 NOTE — Assessment & Plan Note (Signed)
Deteriorated. Patient re-educated about  the importance of commitment to a  minimum of 150 minutes of exercise per week. The importance of healthy food choices with portion control discussed. Encouraged to start a food diary, count calories and to consider  joining a support group. Sample diet sheets offered. Goals set by the patient for the next several months.    

## 2012-11-13 NOTE — Assessment & Plan Note (Signed)
Followed by nephrology. 

## 2012-11-13 NOTE — Assessment & Plan Note (Signed)
Treated through hematology ckinic, this is due to renal disease

## 2012-11-13 NOTE — Assessment & Plan Note (Signed)
Pt to start med, ahs had ENT eval of the ndules

## 2012-11-13 NOTE — Assessment & Plan Note (Signed)
Elevated LDL, dietary attentiuon to this stressed

## 2012-11-13 NOTE — Assessment & Plan Note (Signed)
Uncontrolled , dose increase in med also pt encouraged to increase physical activity also reduce sodium and work on weight loss

## 2012-12-06 ENCOUNTER — Other Ambulatory Visit: Payer: Self-pay | Admitting: Nephrology

## 2012-12-06 ENCOUNTER — Encounter (HOSPITAL_COMMUNITY): Payer: Medicare Other

## 2012-12-06 ENCOUNTER — Encounter (HOSPITAL_COMMUNITY): Payer: Medicare Other | Attending: Nephrology

## 2012-12-06 VITALS — BP 173/70 | HR 72 | Resp 16

## 2012-12-06 DIAGNOSIS — N184 Chronic kidney disease, stage 4 (severe): Secondary | ICD-10-CM

## 2012-12-06 DIAGNOSIS — D649 Anemia, unspecified: Secondary | ICD-10-CM

## 2012-12-06 DIAGNOSIS — D638 Anemia in other chronic diseases classified elsewhere: Secondary | ICD-10-CM | POA: Insufficient documentation

## 2012-12-06 LAB — RENAL FUNCTION PANEL
Albumin: 3.9 g/dL (ref 3.5–5.2)
GFR calc Af Amer: 15 mL/min — ABNORMAL LOW (ref >90)
GFR calc non Af Amer: 13 mL/min — ABNORMAL LOW (ref >90)
Glucose, Bld: 97 mg/dL (ref 70–99)
Phosphorus: 4.9 mg/dL — ABNORMAL HIGH (ref 2.3–4.6)
Potassium: 5.6 mEq/L — ABNORMAL HIGH (ref 3.5–5.1)
Sodium: 137 mEq/L (ref 135–145)

## 2012-12-06 LAB — HEMOGLOBIN AND HEMATOCRIT, BLOOD: HCT: 30.4 % — ABNORMAL LOW (ref 39.0–52.0)

## 2012-12-06 MED ORDER — EPOETIN ALFA 10000 UNIT/ML IJ SOLN
INTRAMUSCULAR | Status: AC
  Filled 2012-12-06: qty 1

## 2012-12-06 MED ORDER — EPOETIN ALFA 10000 UNIT/ML IJ SOLN
8000.0000 [IU] | Freq: Once | INTRAMUSCULAR | Status: AC
  Administered 2012-12-06: 8000 [IU] via SUBCUTANEOUS

## 2012-12-06 NOTE — Progress Notes (Signed)
[name] Snow presents today for injection per the provider's orders.  Procrit administered administration without incident; see MAR for injection details.  Patient tolerated procedure well and without incident.  No questions or complaints noted at this time.

## 2012-12-06 NOTE — Progress Notes (Signed)
Labs drawn today for H&H,renal

## 2013-01-05 ENCOUNTER — Other Ambulatory Visit (HOSPITAL_COMMUNITY): Payer: Medicare Other

## 2013-01-05 ENCOUNTER — Ambulatory Visit (HOSPITAL_COMMUNITY)
Admission: RE | Admit: 2013-01-05 | Discharge: 2013-01-05 | Disposition: A | Discharge status: Home / Self Care | Payer: Medicare Other | Source: Ambulatory Visit | Attending: Nephrology | Admitting: Nephrology

## 2013-01-05 ENCOUNTER — Ambulatory Visit (HOSPITAL_COMMUNITY): Payer: Medicare Other

## 2013-01-05 DIAGNOSIS — D649 Anemia, unspecified: Secondary | ICD-10-CM | POA: Insufficient documentation

## 2013-01-05 DIAGNOSIS — N184 Chronic kidney disease, stage 4 (severe): Secondary | ICD-10-CM | POA: Insufficient documentation

## 2013-01-05 LAB — HEMOGLOBIN AND HEMATOCRIT, BLOOD
HCT: 32.9 % — ABNORMAL LOW (ref 39.0–52.0)
Hemoglobin: 10.7 g/dL — ABNORMAL LOW (ref 13.0–17.0)

## 2013-01-05 LAB — RENAL FUNCTION PANEL
Albumin: 4.1 g/dL (ref 3.5–5.2)
Calcium: 9.3 mg/dL (ref 8.4–10.5)
Creatinine, Ser: 4.59 mg/dL — ABNORMAL HIGH (ref 0.50–1.35)
GFR calc Af Amer: 14 mL/min — ABNORMAL LOW (ref >90)
GFR calc non Af Amer: 12 mL/min — ABNORMAL LOW (ref >90)
Phosphorus: 5 mg/dL — ABNORMAL HIGH (ref 2.3–4.6)
Sodium: 137 mEq/L (ref 135–145)

## 2013-01-05 MED ORDER — EPOETIN ALFA 10000 UNIT/ML IJ SOLN
8000.0000 [IU] | INTRAMUSCULAR | Status: DC
  Administered 2013-01-05: 8000 [IU] via SUBCUTANEOUS
  Filled 2013-01-05: qty 1

## 2013-01-05 NOTE — Progress Notes (Signed)
Results for WINFERD, COLGIN (MRN [id number]) as of 01/05/2013 11:41  Ref. Range 01/05/2013 10:44  Sodium Latest Range: 135-145 mEq/L 137  Potassium Latest Range: 3.5-5.1 mEq/L 5.4 (H)  Chloride Latest Range: 96-112 mEq/L 103  CO2 Latest Range: 19-32 mEq/L 19  BUN Latest Range: 6-23 mg/dL 68 (H)  Creatinine Latest Range: 0.50-1.35 mg/dL 4.59 (H)  Calcium Latest Range: 8.4-10.5 mg/dL 9.3  GFR calc non Af Amer Latest Range: >90 mL/min 12 (L)  GFR calc Af Amer Latest Range: >90 mL/min 14 (L)  Glucose Latest Range: 70-99 mg/dL 102 (H)  Phosphorus Latest Range: 2.3-4.6 mg/dL 5.0 (H)  Albumin Latest Range: 3.5-5.2 g/dL 4.1  Hemoglobin Latest Range: 13.0-17.0 g/dL 10.7 (L)  HCT Latest Range: 39.0-52.0 % 32.9 (L)

## 2013-01-05 NOTE — Progress Notes (Signed)
Hgb and Hct, renal panel drawn; Labs faxed to [facility] Procrit 8000unit given sub-q right arm.dxs:585.4and 285.29 Tolerated well.

## 2013-02-02 ENCOUNTER — Ambulatory Visit (HOSPITAL_COMMUNITY)
Admission: RE | Admit: 2013-02-02 | Discharge: 2013-02-02 | Disposition: A | Discharge status: Home / Self Care | Payer: Medicare Other | Source: Ambulatory Visit | Attending: Nephrology | Admitting: Nephrology

## 2013-02-02 DIAGNOSIS — N189 Chronic kidney disease, unspecified: Secondary | ICD-10-CM | POA: Insufficient documentation

## 2013-02-02 DIAGNOSIS — D638 Anemia in other chronic diseases classified elsewhere: Secondary | ICD-10-CM | POA: Insufficient documentation

## 2013-02-02 LAB — RENAL FUNCTION PANEL
Albumin: 3.9 g/dL (ref 3.5–5.2)
BUN: 52 mg/dL — ABNORMAL HIGH (ref 6–23)
Calcium: 9.4 mg/dL (ref 8.4–10.5)
Chloride: 108 mEq/L (ref 96–112)
Creatinine, Ser: 4.51 mg/dL — ABNORMAL HIGH (ref 0.50–1.35)
GFR calc non Af Amer: 12 mL/min — ABNORMAL LOW (ref >90)

## 2013-02-02 LAB — HEMOGLOBIN AND HEMATOCRIT, BLOOD
HCT: 31.1 % — ABNORMAL LOW (ref 39.0–52.0)
Hemoglobin: 10.2 g/dL — ABNORMAL LOW (ref 13.0–17.0)

## 2013-02-02 MED ORDER — EPOETIN ALFA 10000 UNIT/ML IJ SOLN
8000.0000 [IU] | Freq: Once | INTRAMUSCULAR | Status: AC
  Administered 2013-02-02: 8000 [IU] via SUBCUTANEOUS
  Filled 2013-02-02: qty 1

## 2013-02-02 NOTE — Progress Notes (Signed)
Results for [name] Snow, [name] Snow (MRN [id number]) as of 02/02/2013 12:33 Labs received and faxed to MD  Ref. Range 02/02/2013 10:28 02/02/2013 10:29  Sodium Latest Range: 135-145 mEq/L  138  Potassium Latest Range: 3.5-5.1 mEq/L  4.9  Chloride Latest Range: 96-112 mEq/L  108  CO2 Latest Range: 19-32 mEq/L  20  BUN Latest Range: 6-23 mg/dL  52 (H)  Creatinine Latest Range: 0.50-1.35 mg/dL  4.51 (H)  Calcium Latest Range: 8.4-10.5 mg/dL  9.4  GFR calc non Af Amer Latest Range: >90 mL/min  12 (L)  GFR calc Af Amer Latest Range: >90 mL/min  14 (L)  Glucose Latest Range: 70-99 mg/dL  102 (H)  Phosphorus Latest Range: 2.3-4.6 mg/dL  3.6  Albumin Latest Range: 3.5-5.2 g/dL  3.9  Hemoglobin Latest Range: 13.0-17.0 g/dL 10.2 (L)   HCT Latest Range: 39.0-52.0 % 31.1 (L)

## 2013-02-02 NOTE — Progress Notes (Addendum)
Pt arrived for scheduled blood work and Procrit injection. Procrit 8000 units SQ given in right deltoid after blood work sent to lab. Tolerated well.  Dx. 585.7 &  285.29 per Dr. Lowanda Foster

## 2013-02-14 ENCOUNTER — Other Ambulatory Visit: Payer: Self-pay | Admitting: Family Medicine

## 2013-02-18 ENCOUNTER — Other Ambulatory Visit: Payer: Self-pay | Admitting: Family Medicine

## 2013-02-20 ENCOUNTER — Other Ambulatory Visit: Payer: Self-pay | Admitting: Family Medicine

## 2013-03-06 ENCOUNTER — Encounter (HOSPITAL_COMMUNITY)
Admission: RE | Admit: 2013-03-06 | Discharge: 2013-03-06 | Disposition: A | Discharge status: Home / Self Care | Payer: Medicare Other | Source: Ambulatory Visit | Attending: Nephrology | Admitting: Nephrology

## 2013-03-06 DIAGNOSIS — D649 Anemia, unspecified: Secondary | ICD-10-CM | POA: Insufficient documentation

## 2013-03-06 DIAGNOSIS — N189 Chronic kidney disease, unspecified: Secondary | ICD-10-CM | POA: Insufficient documentation

## 2013-03-06 LAB — RENAL FUNCTION PANEL
Albumin: 3.9 g/dL (ref 3.5–5.2)
BUN: 57 mg/dL — ABNORMAL HIGH (ref 6–23)
Creatinine, Ser: 4.21 mg/dL — ABNORMAL HIGH (ref 0.50–1.35)
Glucose, Bld: 96 mg/dL (ref 70–99)
Phosphorus: 4.3 mg/dL (ref 2.3–4.6)
Potassium: 5.2 mEq/L — ABNORMAL HIGH (ref 3.5–5.1)

## 2013-03-06 LAB — HEMOGLOBIN AND HEMATOCRIT, BLOOD
HCT: 30.5 % — ABNORMAL LOW (ref 39.0–52.0)
Hemoglobin: 10.3 g/dL — ABNORMAL LOW (ref 13.0–17.0)

## 2013-03-06 MED ORDER — EPOETIN ALFA 10000 UNIT/ML IJ SOLN
8000.0000 [IU] | Freq: Once | INTRAMUSCULAR | Status: AC
  Administered 2013-03-06: 8000 [IU] via SUBCUTANEOUS

## 2013-03-06 MED ORDER — EPOETIN ALFA 10000 UNIT/ML IJ SOLN
INTRAMUSCULAR | Status: AC
  Filled 2013-03-06: qty 1

## 2013-03-06 NOTE — Progress Notes (Signed)
Arrived to Short Stay via w/c with wife for blood draw and [place] injection procrit for chronic anemia and renal failure per order Dr Hinda Lenis. H&H and renal panel drawn and sent to lab. Procrit 8000units given sq without waiting for lab results per order.D?C to car via w/c in satisfactory condition.

## 2013-03-06 NOTE — Progress Notes (Signed)
Results for CREG, HAMZA (MRN [id number]) as of 03/06/2013 13:52  Ref. Range 03/06/2013 10:00  Hemoglobin Latest Range: 13.0-17.0 g/dL 10.3 (L)  HCT Latest Range: 39.0-52.0 % 30.5 (L)  Glucose Latest Range: 70-99 mg/dL 96

## 2013-03-06 NOTE — Progress Notes (Signed)
Results for [name], Snow (MRN [id number]) as of 03/06/2013 13:52  Ref. Range 03/06/2013 10:00  Sodium Latest Range: 135-145 mEq/L 139  Potassium Latest Range: 3.5-5.1 mEq/L 5.2 (H)  Chloride Latest Range: 96-112 mEq/L 108  CO2 Latest Range: 19-32 mEq/L 21  BUN Latest Range: 6-23 mg/dL 57 (H)  Creatinine Latest Range: 0.50-1.35 mg/dL 4.21 (H)  Calcium Latest Range: 8.4-10.5 mg/dL 9.3  GFR calc non Af Amer Latest Range: >90 mL/min 13 (L)  GFR calc Af Amer Latest Range: >90 mL/min 15 (L)  Glucose Latest Range: 70-99 mg/dL 96  PhosphorusResults for [name], Snow (MRN [id number]) as of 03/06/2013 13:52  Ref. Range 03/06/2013 10:00  Hemoglobin Latest Range: 13.0-17.0 g/dL 10.3 (L)  HCT Latest Range: 39.0-52.0 % 30.5 (L)  Glucose Latest Range: 70-99 mg/dL 96   Latest Range: 2.3-4.6 mg/dL 4.3  Albumin Latest Range: 3.5-5.2 g/dL 3.9

## 2013-03-08 ENCOUNTER — Other Ambulatory Visit: Payer: Self-pay | Admitting: Family Medicine

## 2013-03-09 ENCOUNTER — Encounter: Payer: Self-pay | Admitting: Family Medicine

## 2013-03-09 ENCOUNTER — Ambulatory Visit (INDEPENDENT_AMBULATORY_CARE_PROVIDER_SITE_OTHER): Payer: Medicare Other | Admitting: Family Medicine

## 2013-03-09 VITALS — BP 154/82 | HR 72 | Resp 18 | Ht 65.0 in | Wt 320.1 lb

## 2013-03-09 DIAGNOSIS — D649 Anemia, unspecified: Secondary | ICD-10-CM

## 2013-03-09 DIAGNOSIS — R7989 Other specified abnormal findings of blood chemistry: Secondary | ICD-10-CM

## 2013-03-09 DIAGNOSIS — I878 Other specified disorders of veins: Secondary | ICD-10-CM

## 2013-03-09 DIAGNOSIS — I10 Essential (primary) hypertension: Secondary | ICD-10-CM

## 2013-03-09 DIAGNOSIS — E785 Hyperlipidemia, unspecified: Secondary | ICD-10-CM

## 2013-03-09 DIAGNOSIS — I872 Venous insufficiency (chronic) (peripheral): Secondary | ICD-10-CM

## 2013-03-09 LAB — LIPID PANEL
HDL: 39 mg/dL — ABNORMAL LOW (ref >39)
Total CHOL/HDL Ratio: 4.5 Ratio
Triglycerides: 92 mg/dL (ref <150)

## 2013-03-09 MED ORDER — ALLOPURINOL 300 MG PO TABS: ORAL_TABLET | ORAL | Status: DC

## 2013-03-09 MED ORDER — METOPROLOL SUCCINATE ER 100 MG PO TB24: 100.0000 mg | ORAL_TABLET | Freq: Every day | ORAL | Status: DC

## 2013-03-09 NOTE — Progress Notes (Signed)
  Subjective:    Patient ID: [name] Snow, male    DOB: [birth year], 72 y.o.   MRN: [id number]  HPI The PT is here for follow up and re-evaluation of chronic medical conditions, medication management and review of any available recent lab and radiology data.  Preventive health is updated, specifically  Cancer screening and Immunization.    The PT denies any adverse reactions to current medications since the last visit.  There are no new concerns.  There are no specific complaints       Review of Systems See HPI Denies recent fever or chills. Denies sinus pressure, nasal congestion, ear pain or sore throat. Denies chest congestion, productive cough or wheezing. Denies chest pains, palpitations and leg swelling Denies abdominal pain, nausea, vomiting,diarrhea or constipation.  Has functioning colostomy Denies dysuria, frequency, hesitancy or incontinence. Denies joint pain,  However, marked  limitation in mobility depends on a walker Denies headaches, seizures, numbness, or tingling. Denies depression, anxiety or insomnia. Denies skin break down or rash.        Objective:   Physical Exam  Patient alert and oriented and in no cardiopulmonary distress.  HEENT: No facial asymmetry, EOMI, no sinus tenderness,  oropharynx pink and moist.  Neck decreased ROMno adenopathy.  Chest: Clear to auscultation bilaterally.  CVS: S1, S2 no murmurs, no S3.  ABD: Soft non tender. Bowel sounds normal.  Ext: No edema  MS: decreased  ROM spine, shoulders, hips and knees.  Skin: Intact, no ulcerations or rash noted.  Psych: Good eye contact, normal affect. Memory impaired, mild ME, not anxious or depressed appearing.  CNS: CN 2-12 intact,decreased  Power,and  tone a in lower extremities       Assessment & Plan:

## 2013-03-09 NOTE — Patient Instructions (Addendum)
Annual wellness in early December, call if you need me before  No changes in medication.  No labs due at this time.  Call for flu vaccine in October please

## 2013-03-12 NOTE — Assessment & Plan Note (Addendum)
Improved, no meds indicated at this time. Hyperlipidemia:Low fat diet discussed and encouraged.

## 2013-03-12 NOTE — Assessment & Plan Note (Signed)
Adequate control, no med change 

## 2013-03-12 NOTE — Assessment & Plan Note (Signed)
Controlled, no change in medication  

## 2013-03-12 NOTE — Assessment & Plan Note (Signed)
Skin of lower ext intact , no current ulcers

## 2013-03-12 NOTE — Assessment & Plan Note (Signed)
Continue treatment through hematology clinic

## 2013-03-12 NOTE — Assessment & Plan Note (Signed)
Deteriorated. Patient re-educated about  the importance of commitment to a  minimum of 150 minutes of exercise per week. The importance of healthy food choices with portion control discussed. Encouraged to start a food diary, count calories and to consider  joining a support group. Sample diet sheets offered. Goals set by the patient for the next several months.    

## 2013-04-04 ENCOUNTER — Encounter (HOSPITAL_COMMUNITY)
Admission: RE | Admit: 2013-04-04 | Discharge: 2013-04-04 | Disposition: A | Discharge status: Home / Self Care | Payer: Medicare Other | Source: Ambulatory Visit | Attending: Nephrology | Admitting: Nephrology

## 2013-04-04 DIAGNOSIS — D649 Anemia, unspecified: Secondary | ICD-10-CM | POA: Insufficient documentation

## 2013-04-04 LAB — RENAL FUNCTION PANEL
BUN: 42 mg/dL — ABNORMAL HIGH (ref 6–23)
Chloride: 106 mEq/L (ref 96–112)
Creatinine, Ser: 3.77 mg/dL — ABNORMAL HIGH (ref 0.50–1.35)
Glucose, Bld: 99 mg/dL (ref 70–99)
Potassium: 5.1 mEq/L (ref 3.5–5.1)

## 2013-04-04 MED ORDER — EPOETIN ALFA 10000 UNIT/ML IJ SOLN
8000.0000 [IU] | INTRAMUSCULAR | Status: DC
  Administered 2013-04-04: 8000 [IU] via SUBCUTANEOUS

## 2013-04-04 MED ORDER — EPOETIN ALFA 10000 UNIT/ML IJ SOLN
INTRAMUSCULAR | Status: AC
  Filled 2013-04-04: qty 1

## 2013-04-04 NOTE — Progress Notes (Signed)
Labs drawn before giving Epogen 8000 units SQ per MD order.   Results for [name] Snow, [name] Snow (MRN [id number]) as of 04/04/2013 10:59  Ref. Range 04/04/2013 10:17 04/04/2013 10:19  Sodium Latest Range: 135-145 mEq/L  136  Potassium Latest Range: 3.5-5.1 mEq/L  5.1  Chloride Latest Range: 96-112 mEq/L  106  CO2 Latest Range: 19-32 mEq/L  19  BUN Latest Range: 6-23 mg/dL  42 (H)  Creatinine Latest Range: 0.50-1.35 mg/dL  3.77 (H)  Calcium Latest Range: 8.4-10.5 mg/dL  9.2  GFR calc non Af Amer Latest Range: >90 mL/min  15 (L)  GFR calc Af Amer Latest Range: >90 mL/min  17 (L)  Glucose Latest Range: 70-99 mg/dL  99  Phosphorus Latest Range: 2.3-4.6 mg/dL  3.3  Albumin Latest Range: 3.5-5.2 g/dL  4.0  Hemoglobin Latest Range: 13.0-17.0 g/dL 10.7 (L)   HCT Latest Range: 39.0-52.0 % 32.8 (L)

## 2013-05-02 ENCOUNTER — Ambulatory Visit (HOSPITAL_COMMUNITY): Payer: Medicare Other

## 2013-05-02 ENCOUNTER — Other Ambulatory Visit (HOSPITAL_COMMUNITY): Payer: Medicare Other

## 2013-05-06 ENCOUNTER — Other Ambulatory Visit: Payer: Self-pay | Admitting: Family Medicine

## 2013-05-09 ENCOUNTER — Encounter (HOSPITAL_COMMUNITY)
Admission: RE | Admit: 2013-05-09 | Discharge: 2013-05-09 | Disposition: A | Discharge status: Home / Self Care | Payer: Medicare Other | Source: Ambulatory Visit | Attending: Nephrology | Admitting: Nephrology

## 2013-05-09 DIAGNOSIS — N184 Chronic kidney disease, stage 4 (severe): Secondary | ICD-10-CM | POA: Insufficient documentation

## 2013-05-09 DIAGNOSIS — D631 Anemia in chronic kidney disease: Secondary | ICD-10-CM | POA: Insufficient documentation

## 2013-05-09 LAB — RENAL FUNCTION PANEL
BUN: 45 mg/dL — ABNORMAL HIGH (ref 6–23)
CO2: 13 mEq/L — ABNORMAL LOW (ref 19–32)
Chloride: 105 mEq/L (ref 96–112)
GFR calc Af Amer: 16 mL/min — ABNORMAL LOW (ref >90)
Glucose, Bld: 97 mg/dL (ref 70–99)
Potassium: 6 mEq/L — ABNORMAL HIGH (ref 3.5–5.1)
Sodium: 134 mEq/L — ABNORMAL LOW (ref 135–145)

## 2013-05-09 LAB — HEMOGLOBIN AND HEMATOCRIT, BLOOD
HCT: 32.8 % — ABNORMAL LOW (ref 39.0–52.0)
Hemoglobin: 10.6 g/dL — ABNORMAL LOW (ref 13.0–17.0)

## 2013-05-09 MED ORDER — EPOETIN ALFA 10000 UNIT/ML IJ SOLN
INTRAMUSCULAR | Status: AC
  Filled 2013-05-09: qty 1

## 2013-05-09 MED ORDER — EPOETIN ALFA 10000 UNIT/ML IJ SOLN
8000.0000 [IU] | Freq: Once | INTRAMUSCULAR | Status: AC
  Administered 2013-05-09: 8000 [IU] via SUBCUTANEOUS

## 2013-05-09 NOTE — Progress Notes (Signed)
Results for JOVANN, LIVERS (MRN [id number]) as of 05/09/2013 11:32  Ref. Range 05/09/2013 10:30  Sodium Latest Range: 135-145 mEq/L 134 (L)  Potassium Latest Range: 3.5-5.1 mEq/L 6.0 (H)  Chloride Latest Range: 96-112 mEq/L 105  CO2 Latest Range: 19-32 mEq/L 13 (L)  BUN Latest Range: 6-23 mg/dL 45 (H)  Creatinine Latest Range: 0.50-1.35 mg/dL 4.04 (H)  Calcium Latest Range: 8.4-10.5 mg/dL 9.0  GFR calc non Af Amer Latest Range: >90 mL/min 14 (L)  GFR calc Af Amer Latest Range: >90 mL/min 16 (L)  Glucose Latest Range: 70-99 mg/dL 97  Phosphorus Latest Range: 2.3-4.6 mg/dL 4.3  Albumin Latest Range: 3.5-5.2 g/dL 3.9  Results for DEVEION, BARTLES (MRN [id number]) as of 05/09/2013 11:32  Ref. Range 05/09/2013 10:30  Hemoglobin Latest Range: 13.0-17.0 g/dL 10.6 (L)  HCT Latest Range: 39.0-52.0 % 32.8 (L)  BP 221/89; HR 79; Rep.28; Sat 100%  SOB today looked "puffy" Epogen 8000Units SQ given return visit in one month

## 2013-06-01 ENCOUNTER — Other Ambulatory Visit: Payer: Self-pay | Admitting: Family Medicine

## 2013-06-06 ENCOUNTER — Encounter (HOSPITAL_COMMUNITY)
Admission: RE | Admit: 2013-06-06 | Discharge: 2013-06-06 | Disposition: A | Discharge status: Home / Self Care | Payer: Medicare Other | Source: Ambulatory Visit | Attending: Nephrology | Admitting: Nephrology

## 2013-06-06 DIAGNOSIS — N184 Chronic kidney disease, stage 4 (severe): Secondary | ICD-10-CM | POA: Insufficient documentation

## 2013-06-06 DIAGNOSIS — D638 Anemia in other chronic diseases classified elsewhere: Secondary | ICD-10-CM | POA: Insufficient documentation

## 2013-06-06 LAB — HEMOGLOBIN AND HEMATOCRIT, BLOOD
HCT: 33 % — ABNORMAL LOW (ref 39.0–52.0)
Hemoglobin: 10.3 g/dL — ABNORMAL LOW (ref 13.0–17.0)

## 2013-06-06 LAB — RENAL FUNCTION PANEL
Albumin: 3.9 g/dL (ref 3.5–5.2)
CO2: 21 mEq/L (ref 19–32)
Calcium: 9.6 mg/dL (ref 8.4–10.5)
Creatinine, Ser: 4.27 mg/dL — ABNORMAL HIGH (ref 0.50–1.35)
GFR calc non Af Amer: 13 mL/min — ABNORMAL LOW (ref >90)
Phosphorus: 5.1 mg/dL — ABNORMAL HIGH (ref 2.3–4.6)

## 2013-06-06 MED ORDER — EPOETIN ALFA 10000 UNIT/ML IJ SOLN
INTRAMUSCULAR | Status: AC
  Filled 2013-06-06: qty 1

## 2013-06-06 MED ORDER — EPOETIN ALFA 10000 UNIT/ML IJ SOLN
8000.0000 [IU] | INTRAMUSCULAR | Status: AC
  Administered 2013-06-06: 8000 [IU] via SUBCUTANEOUS

## 2013-06-06 NOTE — Progress Notes (Signed)
Arrived for monthly apt. Procrit. H&H, renal pnl drawn without difficulty. Med given. Tolerated well.

## 2013-07-02 ENCOUNTER — Other Ambulatory Visit: Payer: Self-pay | Admitting: Family Medicine

## 2013-07-04 ENCOUNTER — Encounter (INDEPENDENT_AMBULATORY_CARE_PROVIDER_SITE_OTHER): Payer: Self-pay

## 2013-07-04 ENCOUNTER — Emergency Department (HOSPITAL_COMMUNITY)
Admission: EM | Admit: 2013-07-04 | Discharge: 2013-07-04 | Disposition: A | Discharge status: Home / Self Care | Payer: Medicare Other | Attending: Emergency Medicine | Admitting: Emergency Medicine

## 2013-07-04 ENCOUNTER — Encounter (HOSPITAL_COMMUNITY): Payer: Self-pay | Admitting: Emergency Medicine

## 2013-07-04 ENCOUNTER — Encounter: Payer: Self-pay | Admitting: Family Medicine

## 2013-07-04 ENCOUNTER — Emergency Department (HOSPITAL_COMMUNITY): Payer: Medicare Other

## 2013-07-04 ENCOUNTER — Ambulatory Visit (INDEPENDENT_AMBULATORY_CARE_PROVIDER_SITE_OTHER): Payer: Medicare Other | Admitting: Family Medicine

## 2013-07-04 VITALS — BP 198/98 | HR 114 | Resp 22 | Ht 65.0 in | Wt 341.1 lb

## 2013-07-04 DIAGNOSIS — I120 Hypertensive chronic kidney disease with stage 5 chronic kidney disease or end stage renal disease: Secondary | ICD-10-CM | POA: Insufficient documentation

## 2013-07-04 DIAGNOSIS — Z992 Dependence on renal dialysis: Secondary | ICD-10-CM | POA: Insufficient documentation

## 2013-07-04 DIAGNOSIS — Z79899 Other long term (current) drug therapy: Secondary | ICD-10-CM | POA: Insufficient documentation

## 2013-07-04 DIAGNOSIS — R109 Unspecified abdominal pain: Secondary | ICD-10-CM

## 2013-07-04 DIAGNOSIS — R1084 Generalized abdominal pain: Secondary | ICD-10-CM | POA: Insufficient documentation

## 2013-07-04 DIAGNOSIS — Z0000 Encounter for general adult medical examination without abnormal findings: Secondary | ICD-10-CM

## 2013-07-04 DIAGNOSIS — N186 End stage renal disease: Secondary | ICD-10-CM | POA: Insufficient documentation

## 2013-07-04 DIAGNOSIS — Z8719 Personal history of other diseases of the digestive system: Secondary | ICD-10-CM | POA: Insufficient documentation

## 2013-07-04 DIAGNOSIS — Z9889 Other specified postprocedural states: Secondary | ICD-10-CM | POA: Insufficient documentation

## 2013-07-04 DIAGNOSIS — IMO0002 Reserved for concepts with insufficient information to code with codable children: Secondary | ICD-10-CM | POA: Insufficient documentation

## 2013-07-04 LAB — CBC WITH DIFFERENTIAL/PLATELET
Basophils Relative: 0 % (ref 0–1)
Eosinophils Absolute: 0.1 10*3/uL (ref 0.0–0.7)
Hemoglobin: 10.3 g/dL — ABNORMAL LOW (ref 13.0–17.0)
MCH: 25.8 pg — ABNORMAL LOW (ref 26.0–34.0)
MCHC: 31.5 g/dL (ref 30.0–36.0)
Monocytes Relative: 8 % (ref 3–12)
Neutrophils Relative %: 75 % (ref 43–77)
RDW: 16 % — ABNORMAL HIGH (ref 11.5–15.5)

## 2013-07-04 LAB — URINE MICROSCOPIC-ADD ON

## 2013-07-04 LAB — COMPREHENSIVE METABOLIC PANEL
Albumin: 3.8 g/dL (ref 3.5–5.2)
Alkaline Phosphatase: 48 U/L (ref 39–117)
BUN: 36 mg/dL — ABNORMAL HIGH (ref 6–23)
Creatinine, Ser: 3.99 mg/dL — ABNORMAL HIGH (ref 0.50–1.35)
Potassium: 5.1 mEq/L (ref 3.5–5.1)
Total Protein: 7.2 g/dL (ref 6.0–8.3)

## 2013-07-04 LAB — URINALYSIS, ROUTINE W REFLEX MICROSCOPIC
Leukocytes, UA: NEGATIVE
Nitrite: NEGATIVE
Specific Gravity, Urine: 1.025 (ref 1.005–1.030)
pH: 6 (ref 5.0–8.0)

## 2013-07-04 LAB — LIPASE, BLOOD: Lipase: 32 U/L (ref 11–59)

## 2013-07-04 MED ORDER — TRAMADOL HCL 50 MG PO TABS: 50.0000 mg | ORAL_TABLET | Freq: Four times a day (QID) | ORAL | Status: DC | PRN

## 2013-07-04 MED ORDER — IOHEXOL 300 MG/ML  SOLN
50.0000 mL | Freq: Once | INTRAMUSCULAR | Status: AC | PRN
  Administered 2013-07-04: 50 mL via ORAL

## 2013-07-04 NOTE — ED Notes (Signed)
abd pain around colostomy x 2 days.  Sent by Dr. Moshe Cipro for ? SBO.  Reports no output from colostomy since this morning.  Denies n/v.  Decreased PO intake.

## 2013-07-04 NOTE — ED Notes (Signed)
Emptied pt's colostomy bag.

## 2013-07-04 NOTE — ED Notes (Signed)
Pt reports being sick for several months w/ generalized ab pain. No nausea, vomiting or diarrhea (pt has a colostomy). No fever. Normal po intake.  Was sent from d.r simposon's office today for furhter eval.  Pt does live home alone and is assisted by his sister.

## 2013-07-04 NOTE — ED Provider Notes (Signed)
CSN: [id number]     Arrival date & time 07/04/13  1618 History   First MD Initiated Contact with Patient 07/04/13 1644     Chief Complaint  Patient presents with  . Abdominal Pain   (Consider location/radiation/quality/duration/timing/severity/associated sxs/prior Treatment) Patient is a 72 y.o. male presenting with abdominal pain. The history is provided by the patient (the pt complains of abd pain around his colostomy).  Abdominal Pain Pain location:  Generalized Pain quality: aching   Pain radiates to:  Does not radiate Pain severity:  Moderate Onset quality:  Gradual Timing:  Constant Associated symptoms: no chest pain, no cough, no diarrhea, no fatigue and no hematuria     Past Medical History  Diagnosis Date  . Chronic pancreatitis   . Hyperlipidemia   . Colostomy in place   . Intestinal obstruction   . Alcohol abuse   . Morbid obesity   . Hypertension   . Dialysis care   . Metabolic acidosis [id number]   Past Surgical History  Procedure Laterality Date  . Colectomy    . Partial toenail removal  of the right great toe secondary to recurrent infection from ingrown toenail     Family History  Problem Relation Age of Onset  . Hypertension Sister   . Hypertension Mother   . Hypertension Father    History  Substance Use Topics  . Smoking status: Never Smoker   . Smokeless tobacco: Not on file  . Alcohol Use: No    Review of Systems  Constitutional: Negative for appetite change and fatigue.  HENT: Negative for congestion, ear discharge and sinus pressure.   Eyes: Negative for discharge.  Respiratory: Negative for cough.   Cardiovascular: Negative for chest pain.  Gastrointestinal: Positive for abdominal pain. Negative for diarrhea.  Genitourinary: Negative for frequency and hematuria.  Musculoskeletal: Negative for back pain.  Skin: Negative for rash.  Neurological: Negative for seizures and headaches.  Psychiatric/Behavioral: Negative for hallucinations.     Allergies  Review of patient's allergies indicates no known allergies.  Home Medications   Current Outpatient Rx  Name  Route  Sig  Dispense  Refill  . allopurinol (ZYLOPRIM) 300 MG tablet   Oral   Take 300 mg by mouth daily.         . calcitRIOL (ROCALTROL) 0.25 MCG capsule   Oral   Take 0.25 mcg by mouth daily.         . cloNIDine (CATAPRES) 0.3 MG tablet   Oral   Take 0.3 mg by mouth 2 (two) times daily.         Marland Kitchen levothyroxine (SYNTHROID) 50 MCG tablet   Oral   Take 1 tablet (50 mcg total) by mouth daily.   30 tablet   11   . metoprolol succinate (TOPROL-XL) 50 MG 24 hr tablet   Oral   Take 50 mg by mouth daily. Take with or immediately following a meal.         . sodium bicarbonate 650 MG tablet   Oral   Take 650 mg by mouth 3 (three) times daily.         Marland Kitchen spironolactone (ALDACTONE) 25 MG tablet   Oral   Take 1 tablet (25 mg total) by mouth daily.   30 tablet   4   . torsemide (DEMADEX) 20 MG tablet   Oral   Take 1 tablet (20 mg total) by mouth daily. Take 2 tablets by mouth once daily   60 tablet   3   .  triamcinolone cream (KENALOG) 0.1 %   Topical   Apply 1 application topically daily.         . valsartan (DIOVAN) 320 MG tablet   Oral   Take 320 mg by mouth daily.         . traMADol (ULTRAM) 50 MG tablet   Oral   Take 1 tablet (50 mg total) by mouth every 6 (six) hours as needed.   20 tablet   0    BP 219/79  Pulse 70  Temp(Src) 98.6 F (37 C) (Oral)  Resp 18  Ht 5\' 5"  (1.651 m)  Wt 341 lb (154.677 kg)  BMI 56.75 kg/m2  SpO2 100% Physical Exam  Constitutional: He is oriented to person, place, and time. He appears well-developed.  HENT:  Head: Normocephalic.  Eyes: Conjunctivae and EOM are normal. No scleral icterus.  Neck: Neck supple. No thyromegaly present.  Cardiovascular: Normal rate and regular rhythm.  Exam reveals no gallop and no friction rub.   No murmur heard. Pulmonary/Chest: No stridor. He has no  wheezes. He has no rales. He exhibits no tenderness.  Abdominal: He exhibits no distension. There is tenderness. There is no rebound.  Mild tenderness around colostomy  Musculoskeletal: Normal range of motion. He exhibits no edema.  Lymphadenopathy:    He has no cervical adenopathy.  Neurological: He is oriented to person, place, and time. He exhibits normal muscle tone. Coordination normal.  Skin: No rash noted. No erythema.  Psychiatric: He has a normal mood and affect. His behavior is normal.    ED Course  Procedures (including critical care time) Labs Review Labs Reviewed  CBC WITH DIFFERENTIAL - Abnormal; Notable for the following:    RBC 3.99 (*)    Hemoglobin 10.3 (*)    HCT 32.7 (*)    MCH 25.8 (*)    RDW 16.0 (*)    All other components within normal limits  COMPREHENSIVE METABOLIC PANEL - Abnormal; Notable for the following:    Glucose, Bld 100 (*)    BUN 36 (*)    Creatinine, Ser 3.99 (*)    GFR calc non Af Amer 14 (*)    GFR calc Af Amer 16 (*)    All other components within normal limits  URINALYSIS, ROUTINE W REFLEX MICROSCOPIC - Abnormal; Notable for the following:    Hgb urine dipstick MODERATE (*)    Protein, ur 100 (*)    All other components within normal limits  URINE MICROSCOPIC-ADD ON - Abnormal; Notable for the following:    Casts GRANULAR CAST (*)    All other components within normal limits  LIPASE, BLOOD   Imaging Review Ct Abdomen Pelvis Wo Contrast  07/04/2013   CLINICAL DATA:  Abdomen pain around colostomy.  EXAM: CT ABDOMEN AND PELVIS WITHOUT CONTRAST  TECHNIQUE: Multidetector CT imaging of the abdomen and pelvis was performed following the standard protocol without intravenous contrast.  COMPARISON:  August 05, 2011  FINDINGS: The liver, spleen, pancreas are normal. The patient is status post prior cholecystectomy. The bilateral adrenal glands are normal. There is small nonobstructing stones within the right kidney. There is no hydronephrosis  bilaterally. Mild chronic bilateral perinephric stranding are noted. There is atherosclerosis of the abdominal aorta without aneurysmal dilatation. There is no small bowel obstruction. Right lower quadrant ostomy is identified. Postsurgical changes are identified within the pelvis. The bladder is decompressed with Foley catheter in place. Bilateral inguinal herniation of mesenteric fat are noted. The there is mild dependent  atelectasis of the posterior bilateral lung bases. There is minimal right pleural effusion. Degenerative joint changes of the spine are noted.  IMPRESSION: Right lower quadrant ostomy identified. There is no evidence of bowel obstruction. No acute abnormality is identified in the abdomen and pelvis.   Electronically Signed   By: Abelardo Diesel M.D.   On: 07/04/2013 20:07    EKG Interpretation   None       MDM   1. Abdominal pain        Maudry Diego, MD 07/04/13 2044

## 2013-07-04 NOTE — Patient Instructions (Addendum)
F/U in 2 month, call if you need me before  Stool test today  Labs past due and are needed, lipid, cmp and EGFR, hBA1C, TSH, vit D, CXR,   You NEED to live in a supervised setting where you can be cared for, you are incapable of taking care of yourself. The right thing to do for you, is to make every attempt to get you placed in a supervised living environment. Your sister who is your POA abbsolutely agrees with this and will always be there to support you as long as she is able to   Blood pressure today is dangerously high, you need  To take medication in here before you leave and also take medication every day as prescribed.  Further evaluation of heart and lungs is needed also, your sister will be contacted with this information  Reconsider the flu vaccine you need this  You need to go the eed now

## 2013-07-04 NOTE — Progress Notes (Signed)
Subjective:    Patient ID: [name] Snow, male    DOB: 1941-05-16, 72 y.o.   MRN: [id number]  HPI Preventive Screening-Counseling & Management   Patient present here today for a Medicare annual wellness visit.   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 No children   Risk Factors  Current exercise habits:  None, capable of chair/upper body movement the need to commit to this is discussed as well as to walk around in his homes several times during the day is also encouraged  Dietary issues discussed:Need to reduce portion size , also increase intake of fresh unprocessed food   Cardiac risk factors: Vascular disease on imaging study, uncontrolled HTN, and hyperlipidemia  Depression Screen  (Note: if answer to either of the following is "Yes", a more complete depression screening is indicated)   Over the past two weeks, have you felt down, depressed or hopeless? No  Over the past two weeks, have you felt little interest or pleasure in doing things? No  Have you lost interest or pleasure in daily life? No  Do you often feel hopeless? No , no insight as o the extent of limitation in his ability to function is expressed or appreciated by pt Do you cry easily over simple problems? Pt teary eyed and depressed appearing  Whenever the need for assisted living facility discussed  Activities of Daily Living  In your present state of health, do you have any difficulty performing the following activities?  Driving?: unable Managing money?: yes sister has been responsible for his finances for years, mentally challenged, brain damage may be due to past excessive alcohol intake Feeding yourself?:No Getting from bed to chair?:yes Climbing a flight of  stairs?:yes, essentially incapable Preparing food and eating?:unable to safely prepare his own food, sister prepares meals and leaves them for him to warm in a microwave, no difficulty eating Bathing or showering?:yes Getting dressed?:yes Getting to the toilet?:yes Using the toilet?:No Moving around from place to place?: yes, marked limitation in mobility due to morbid obesity  Fall Risk Assessment In the past year have you fallen or had a near fall?:No Are you currently taking any medications that make you dizzy?:No   Hearing Difficulties: No Do you often ask people to speak up or repeat themselves?:yes Do you experience ringing or noises in your ears?:No Do you have difficulty understanding soft or whispered voices?:yes  Cognitive Testing  Alert? Yes Normal Appearance?no  Oriented to person? Yes Place? Yes  Time? no  Displays appropriate judgment?no  Can read the correct time from a watch face? yes Are you having problems remembering things?yes  Advanced Directives have been discussed with the patient?Yes, but limitation in understanding, his sister who has POA states states DNR ( form provided documenting sister as his POA) Patient is incapable of either caring for himself.living independently, or making sound medical decisions, needs to be in a facility, again reiterated at this visit. Sister seems "ready" this time. By endo fo visit, she kept expressing real concern re increased shortness of breath noted in recent times, and pt's blood pressure was markedly elevated , so he was sent to Ed for further eval at the time   List the Names of Other Physician/Practitioners you currently use: nephrology, Dr Birdie Sons, hematology  Indicate any recent Medical Services you may have received from other than [facility] providers in the past year (date may be approximate).   Assessment:    Annual Wellness Exam   Plan:    During the course of the visit the patient was educated and counseled  about appropriate screening and preventive services including:  A healthy diet is rich in fruit, vegetables and whole grains. Poultry fish, nuts and beans are a healthy choice for protein rather then red meat. A low sodium diet and drinking 64 ounces of water daily is generally recommended. Oils and sweet should be limited. Carbohydrates especially for those who are diabetic or overweight, should be limited to 30-45 gram per meal. It is important to eat on a regular schedule, at least 3 times daily. Snacks should be primarily fruits, vegetables or nuts. It is important that you exercise regularly at least 30 minutes 5 times a week. If you develop chest pain, have severe difficulty breathing, or feel very tired, stop exercising immediately and seek medical attention  Immunization reviewed and updated. Cancer screening reviewed and updated    Patient Instructions (the written plan) was given to the patient.  Medicare Attestation  I have personally reviewed:  The patient's medical and social history  Their use of alcohol, tobacco or illicit drugs  Their current medications and supplements  The patient's functional ability including ADLs,fall risks, home safety risks, cognitive, and hearing and visual impairment  Diet and physical activities  Evidence for depression or mood disorders  The patient's weight, height, BMI, and visual acuity have been recorded in the chart. I have made referrals, counseling, and provided education to the patient based on review of the above and I have provided the patient with a written personalized care plan for preventive services.      Review of Systems     Objective:   Physical Exam        Assessment & Plan:

## 2013-07-05 ENCOUNTER — Other Ambulatory Visit: Payer: Self-pay | Admitting: Family Medicine

## 2013-07-05 ENCOUNTER — Telehealth: Payer: Self-pay

## 2013-07-05 NOTE — Telephone Encounter (Signed)
Noted, thanks!

## 2013-07-05 NOTE — Telephone Encounter (Signed)
Called and spoke with sister.  Patient discharged from hospital.  No acute findings.  She would like to proceed with referral to [facility].  This will be sent.  She is also stating that he continues with abd pain.  Ultram was prescribed from ED and she will collect today.

## 2013-07-06 ENCOUNTER — Encounter (HOSPITAL_COMMUNITY): Payer: Medicare Other

## 2013-07-06 ENCOUNTER — Encounter (HOSPITAL_COMMUNITY)
Admission: RE | Admit: 2013-07-06 | Discharge: 2013-07-06 | Disposition: A | Discharge status: Home / Self Care | Payer: Medicare Other | Source: Ambulatory Visit | Attending: Family Medicine | Admitting: Family Medicine

## 2013-07-07 ENCOUNTER — Encounter (HOSPITAL_COMMUNITY)
Admission: RE | Admit: 2013-07-07 | Discharge: 2013-07-07 | Disposition: A | Discharge status: Home / Self Care | Payer: Medicare Other | Source: Ambulatory Visit | Attending: Nephrology | Admitting: Nephrology

## 2013-07-07 DIAGNOSIS — D638 Anemia in other chronic diseases classified elsewhere: Secondary | ICD-10-CM | POA: Insufficient documentation

## 2013-07-07 DIAGNOSIS — N184 Chronic kidney disease, stage 4 (severe): Secondary | ICD-10-CM | POA: Insufficient documentation

## 2013-07-07 NOTE — Progress Notes (Signed)
Results for [name] Snow, [name] Snow (MRN [id number]) as of 07/07/2013 10:17  Ref. Range 07/04/2013 17:03  Sodium Latest Range: 135-145 mEq/L 138  Potassium Latest Range: 3.5-5.1 mEq/L 5.1  Chloride Latest Range: 96-112 mEq/L 107  CO2 Latest Range: 19-32 mEq/L 19  BUN Latest Range: 6-23 mg/dL 36 (H)  Creatinine Latest Range: 0.50-1.35 mg/dL 3.99 (H)  Calcium Latest Range: 8.4-10.5 mg/dL 9.2  GFR calc non Af Amer Latest Range: >90 mL/min 14 (L)  GFR calc Af Amer Latest Range: >90 mL/min 16 (L)  Glucose Latest Range: 70-99 mg/dL 100 (H)  Alkaline Phosphatase Latest Range: 39-117 U/L 48  Albumin Latest Range: 3.5-5.2 g/dL 3.8  Lipase Latest Range: 11-59 U/L 32  AST Latest Range: 0-37 U/L 14  ALT Latest Range: 0-53 U/L 7  Total Protein Latest Range: 6.0-8.3 g/dL 7.2  Total Bilirubin Latest Range: 0.3-1.2 mg/dL 0.8  WBC Latest Range: 4.0-10.5 K/uL 6.4  RBC Latest Range: 4.22-5.81 MIL/uL 3.99 (L)  Hemoglobin Latest Range: 13.0-17.0 g/dL 10.3 (L)  HCT Latest Range: 39.0-52.0 % 32.7 (L)  MCV Latest Range: 78.0-100.0 fL 82.0  MCH Latest Range: 26.0-34.0 pg 25.8 (L)  MCHC Latest Range: 30.0-36.0 g/dL 31.5  RDW Latest Range: 11.5-15.5 % 16.0 (H)  Platelets Latest Range: 150-400 K/uL 206  Neutrophils Relative % Latest Range: 43-77 % 75  Lymphocytes Relative Latest Range: 12-46 % 15  Monocytes Relative Latest Range: 3-12 % 8  Eosinophils Relative Latest Range: 0-5 % 1  Basophils Relative Latest Range: 0-1 % 0  NEUT# Latest Range: 1.7-7.7 K/uL 4.8  Lymphocytes Absolute Latest Range: 0.7-4.0 K/uL 1.0  Monocytes Absolute Latest Range: 0.1-1.0 K/uL 0.5  Eosinophils Absolute Latest Range: 0.0-0.7 K/uL 0.1  Basophils Absolute Latest Range: 0.0-0.1 K/uL 0.0  Procrit held.  Hgb 10.3 on 07/04/13.

## 2013-07-12 ENCOUNTER — Ambulatory Visit (HOSPITAL_COMMUNITY): Payer: Medicare Other

## 2013-07-13 ENCOUNTER — Encounter: Payer: Medicare Other | Admitting: Family Medicine

## 2013-07-31 ENCOUNTER — Emergency Department (HOSPITAL_COMMUNITY): Payer: Medicare Other

## 2013-07-31 ENCOUNTER — Inpatient Hospital Stay (HOSPITAL_COMMUNITY)
Admission: EM | Admit: 2013-07-31 | Discharge: 2013-08-02 | DRG: 558 | Disposition: A | Discharge status: Skilled Nursing Facility | Payer: Medicare Other | Attending: Internal Medicine | Admitting: Internal Medicine

## 2013-07-31 ENCOUNTER — Encounter (HOSPITAL_COMMUNITY): Payer: Self-pay | Admitting: Emergency Medicine

## 2013-07-31 ENCOUNTER — Telehealth: Payer: Self-pay

## 2013-07-31 ENCOUNTER — Other Ambulatory Visit: Payer: Self-pay | Admitting: Family Medicine

## 2013-07-31 DIAGNOSIS — D631 Anemia in chronic kidney disease: Secondary | ICD-10-CM | POA: Diagnosis present

## 2013-07-31 DIAGNOSIS — R5381 Other malaise: Secondary | ICD-10-CM | POA: Diagnosis present

## 2013-07-31 DIAGNOSIS — I509 Heart failure, unspecified: Secondary | ICD-10-CM | POA: Diagnosis present

## 2013-07-31 DIAGNOSIS — L02419 Cutaneous abscess of limb, unspecified: Secondary | ICD-10-CM | POA: Diagnosis present

## 2013-07-31 DIAGNOSIS — E669 Obesity, unspecified: Secondary | ICD-10-CM

## 2013-07-31 DIAGNOSIS — E8779 Other fluid overload: Secondary | ICD-10-CM | POA: Diagnosis present

## 2013-07-31 DIAGNOSIS — R609 Edema, unspecified: Secondary | ICD-10-CM

## 2013-07-31 DIAGNOSIS — I872 Venous insufficiency (chronic) (peripheral): Secondary | ICD-10-CM | POA: Diagnosis present

## 2013-07-31 DIAGNOSIS — R531 Weakness: Secondary | ICD-10-CM

## 2013-07-31 DIAGNOSIS — W19XXXA Unspecified fall, initial encounter: Secondary | ICD-10-CM | POA: Diagnosis present

## 2013-07-31 DIAGNOSIS — R600 Localized edema: Secondary | ICD-10-CM | POA: Diagnosis present

## 2013-07-31 DIAGNOSIS — I5030 Unspecified diastolic (congestive) heart failure: Secondary | ICD-10-CM | POA: Diagnosis present

## 2013-07-31 DIAGNOSIS — Y92009 Unspecified place in unspecified non-institutional (private) residence as the place of occurrence of the external cause: Secondary | ICD-10-CM

## 2013-07-31 DIAGNOSIS — Z66 Do not resuscitate: Secondary | ICD-10-CM | POA: Diagnosis present

## 2013-07-31 DIAGNOSIS — N2581 Secondary hyperparathyroidism of renal origin: Secondary | ICD-10-CM | POA: Diagnosis present

## 2013-07-31 DIAGNOSIS — IMO0002 Reserved for concepts with insufficient information to code with codable children: Secondary | ICD-10-CM

## 2013-07-31 DIAGNOSIS — Z8249 Family history of ischemic heart disease and other diseases of the circulatory system: Secondary | ICD-10-CM

## 2013-07-31 DIAGNOSIS — Z9049 Acquired absence of other specified parts of digestive tract: Secondary | ICD-10-CM

## 2013-07-31 DIAGNOSIS — Z933 Colostomy status: Secondary | ICD-10-CM

## 2013-07-31 DIAGNOSIS — J9819 Other pulmonary collapse: Secondary | ICD-10-CM

## 2013-07-31 DIAGNOSIS — R079 Chest pain, unspecified: Secondary | ICD-10-CM | POA: Diagnosis not present

## 2013-07-31 DIAGNOSIS — J9811 Atelectasis: Secondary | ICD-10-CM

## 2013-07-31 DIAGNOSIS — L03115 Cellulitis of right lower limb: Secondary | ICD-10-CM | POA: Diagnosis present

## 2013-07-31 DIAGNOSIS — E872 Acidosis, unspecified: Secondary | ICD-10-CM | POA: Diagnosis present

## 2013-07-31 DIAGNOSIS — I10 Essential (primary) hypertension: Secondary | ICD-10-CM | POA: Diagnosis present

## 2013-07-31 DIAGNOSIS — E785 Hyperlipidemia, unspecified: Secondary | ICD-10-CM | POA: Diagnosis present

## 2013-07-31 DIAGNOSIS — I129 Hypertensive chronic kidney disease with stage 1 through stage 4 chronic kidney disease, or unspecified chronic kidney disease: Secondary | ICD-10-CM | POA: Diagnosis present

## 2013-07-31 DIAGNOSIS — Z6841 Body Mass Index (BMI) 40.0 and over, adult: Secondary | ICD-10-CM

## 2013-07-31 DIAGNOSIS — R7989 Other specified abnormal findings of blood chemistry: Secondary | ICD-10-CM | POA: Diagnosis present

## 2013-07-31 DIAGNOSIS — M6282 Rhabdomyolysis: Principal | ICD-10-CM | POA: Diagnosis present

## 2013-07-31 DIAGNOSIS — I890 Lymphedema, not elsewhere classified: Secondary | ICD-10-CM | POA: Diagnosis present

## 2013-07-31 DIAGNOSIS — N184 Chronic kidney disease, stage 4 (severe): Secondary | ICD-10-CM | POA: Diagnosis present

## 2013-07-31 LAB — URINE MICROSCOPIC-ADD ON

## 2013-07-31 LAB — CBC WITH DIFFERENTIAL/PLATELET
Basophils Absolute: 0 10*3/uL (ref 0.0–0.1)
Eosinophils Relative: 0 % (ref 0–5)
HCT: 32.7 % — ABNORMAL LOW (ref 39.0–52.0)
Hemoglobin: 10.5 g/dL — ABNORMAL LOW (ref 13.0–17.0)
Lymphocytes Relative: 12 % (ref 12–46)
Lymphs Abs: 1.4 10*3/uL (ref 0.7–4.0)
Monocytes Absolute: 1 10*3/uL (ref 0.1–1.0)
Monocytes Relative: 9 % (ref 3–12)
Neutro Abs: 8.8 10*3/uL — ABNORMAL HIGH (ref 1.7–7.7)
Neutrophils Relative %: 79 % — ABNORMAL HIGH (ref 43–77)
Platelets: 255 10*3/uL (ref 150–400)
RBC: 4.11 MIL/uL — ABNORMAL LOW (ref 4.22–5.81)
RDW: 15.8 % — ABNORMAL HIGH (ref 11.5–15.5)
WBC: 11.2 10*3/uL — ABNORMAL HIGH (ref 4.0–10.5)

## 2013-07-31 LAB — COMPREHENSIVE METABOLIC PANEL
AST: 112 U/L — ABNORMAL HIGH (ref 0–37)
BUN: 38 mg/dL — ABNORMAL HIGH (ref 6–23)
CO2: 18 mEq/L — ABNORMAL LOW (ref 19–32)
Calcium: 9.7 mg/dL (ref 8.4–10.5)
Chloride: 105 mEq/L (ref 96–112)
Creatinine, Ser: 4.02 mg/dL — ABNORMAL HIGH (ref 0.50–1.35)
GFR calc non Af Amer: 14 mL/min — ABNORMAL LOW (ref >90)
Total Bilirubin: 1.3 mg/dL — ABNORMAL HIGH (ref 0.3–1.2)
Total Protein: 7.2 g/dL (ref 6.0–8.3)

## 2013-07-31 LAB — URINALYSIS, ROUTINE W REFLEX MICROSCOPIC
Bilirubin Urine: NEGATIVE
Nitrite: NEGATIVE
Specific Gravity, Urine: 1.025 (ref 1.005–1.030)
pH: 6 (ref 5.0–8.0)

## 2013-07-31 LAB — CK: Total CK: 4896 U/L — ABNORMAL HIGH (ref 7–232)

## 2013-07-31 MED ORDER — ONDANSETRON HCL 4 MG/2ML IJ SOLN: 4.0000 mg | Freq: Four times a day (QID) | INTRAMUSCULAR | Status: DC | PRN

## 2013-07-31 MED ORDER — ALBUTEROL SULFATE (2.5 MG/3ML) 0.083% IN NEBU: 2.5000 mg | INHALATION_SOLUTION | RESPIRATORY_TRACT | Status: DC | PRN

## 2013-07-31 MED ORDER — ALUM & MAG HYDROXIDE-SIMETH 200-200-20 MG/5ML PO SUSP: 30.0000 mL | Freq: Four times a day (QID) | ORAL | Status: DC | PRN

## 2013-07-31 MED ORDER — SODIUM BICARBONATE 650 MG PO TABS
650.0000 mg | ORAL_TABLET | Freq: Three times a day (TID) | ORAL | Status: DC
  Administered 2013-07-31 – 2013-08-02 (×5): 650 mg via ORAL
  Filled 2013-07-31 (×5): qty 1

## 2013-07-31 MED ORDER — CLONIDINE HCL 0.2 MG PO TABS
0.3000 mg | ORAL_TABLET | Freq: Three times a day (TID) | ORAL | Status: DC
  Administered 2013-07-31 – 2013-08-02 (×4): 0.3 mg via ORAL
  Filled 2013-07-31 (×8): qty 1

## 2013-07-31 MED ORDER — ACETAMINOPHEN 650 MG RE SUPP: 650.0000 mg | Freq: Four times a day (QID) | RECTAL | Status: DC | PRN

## 2013-07-31 MED ORDER — CALCITRIOL 0.25 MCG PO CAPS
0.2500 ug | ORAL_CAPSULE | Freq: Every day | ORAL | Status: DC
  Administered 2013-07-31 – 2013-08-02 (×3): 0.25 ug via ORAL
  Filled 2013-07-31 (×3): qty 1

## 2013-07-31 MED ORDER — SODIUM CHLORIDE 0.9 % IJ SOLN
3.0000 mL | Freq: Two times a day (BID) | INTRAMUSCULAR | Status: DC
  Administered 2013-07-31 – 2013-08-02 (×4): 3 mL via INTRAVENOUS

## 2013-07-31 MED ORDER — HYDRALAZINE HCL 20 MG/ML IJ SOLN
10.0000 mg | Freq: Four times a day (QID) | INTRAMUSCULAR | Status: DC | PRN
  Administered 2013-08-01: 10 mg via INTRAVENOUS
  Filled 2013-07-31: qty 1

## 2013-07-31 MED ORDER — ONDANSETRON HCL 4 MG PO TABS: 4.0000 mg | ORAL_TABLET | Freq: Four times a day (QID) | ORAL | Status: DC | PRN

## 2013-07-31 MED ORDER — SODIUM CHLORIDE 0.9 % IV SOLN: INTRAVENOUS | Status: DC

## 2013-07-31 MED ORDER — HYDROCODONE-ACETAMINOPHEN 5-325 MG PO TABS: 1.0000 | ORAL_TABLET | ORAL | Status: DC | PRN

## 2013-07-31 MED ORDER — HEPARIN SODIUM (PORCINE) 5000 UNIT/ML IJ SOLN
5000.0000 [IU] | Freq: Three times a day (TID) | INTRAMUSCULAR | Status: DC
  Administered 2013-07-31 – 2013-08-02 (×5): 5000 [IU] via SUBCUTANEOUS
  Filled 2013-07-31 (×5): qty 1

## 2013-07-31 MED ORDER — ACETAMINOPHEN 325 MG PO TABS
650.0000 mg | ORAL_TABLET | Freq: Four times a day (QID) | ORAL | Status: DC | PRN
  Administered 2013-08-01: 650 mg via ORAL
  Filled 2013-07-31: qty 2

## 2013-07-31 MED ORDER — ALLOPURINOL 300 MG PO TABS
300.0000 mg | ORAL_TABLET | Freq: Every day | ORAL | Status: DC
  Administered 2013-07-31 – 2013-08-02 (×3): 300 mg via ORAL
  Filled 2013-07-31 (×3): qty 1

## 2013-07-31 MED ORDER — METOPROLOL SUCCINATE ER 50 MG PO TB24
50.0000 mg | ORAL_TABLET | Freq: Every day | ORAL | Status: DC
  Administered 2013-07-31: 50 mg via ORAL
  Filled 2013-07-31: qty 1

## 2013-07-31 NOTE — ED Notes (Signed)
Pt has had continued swelling and weakness to lower extremities, has hx of CHF, pt extremely large, has been falling lately, has a dialysis port lt upper arm but has not been used yet. CBG 128. Pt has multiple water blisters on both lower legs, wreaks of urine from incontinence and does have a colostomy, pt is extremely hard of hearing.

## 2013-07-31 NOTE — H&P (Addendum)
PATIENT DETAILS Name: [name] Snow Age: 72 y.o. Sex: male Date of Birth: [birth year] Admit Date: 07/31/2013 [id number] Moshe Cipro, MD   CHIEF COMPLAINT:  Fall  HPI: [name] Snow is a 72 y.o. 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 Patient has been in poor health over the past few years, approximately one month ago patient had significant worsening of his ambulation, he was told by his primary care practitioner that he needs to go to a skilled nursing facility. His sister and his niece were working on getting him to a skilled nursing facility, in the meantime he should was still living alone, with significant supervision from the patient's sister, who would help with feeding and cleaning. In any event, patient was last seen by his sister on Saturday, today he was found by his sister on the floor with feces and urine around him. Patient is a very poor historian, has a chronic speech impediment and only is able to speak in 2-3 word sentences. From the history that I can obtain from the patient with some collaboration from the patient's sister, apparently sometime yesterday afternoon while attempting to go to the bathroom, patient fell and could not subsequently get up. Upon repeatedly asking the patient whether or not he had a syncopal episode, patient claims he did not pass out. He was down on the floor the whole day yesterday, last night and was subsequently found by the patient's sister sometime this morning. Patient was then brought to the ED, where a CT scan of the head and a CT scan of the C-spine was negative for acute abnormalities. He was found to be in rhabdomyolysis. I was then asked to admit this patient for further evaluation and treatment. Patient shakes his head no  when asked if he had fever, headache, diarrhea, cough or any abdominal pain. After speaking with this issue and sister-[name] Snow was in the ED earlier with the patient, earlier his current speech is his baseline. Patient apparently is able to speak in 2-3 word sentences. He has significant generalized weakness, past stopped ambulating for the past one month. Prior to this, he was to take a few steps with the help of a walker.   ALLERGIES:  No Known Allergies  PAST MEDICAL HISTORY: Past Medical History  Diagnosis Date  . Chronic pancreatitis   . Hyperlipidemia   . Colostomy in place   . Intestinal obstruction   . Alcohol abuse   . Morbid obesity   . Hypertension   . Dialysis care   . Metabolic acidosis [id number]    PAST SURGICAL HISTORY: Past Surgical History  Procedure Laterality Date  . Colectomy    . Partial toenail removal  of the right great toe secondary to recurrent infection from ingrown toenail      MEDICATIONS AT HOME: Prior to Admission medications   Medication Sig Start Date End Date Taking? Authorizing Provider  allopurinol (ZYLOPRIM) 300 MG tablet Take 300 mg by mouth daily.   Yes Historical Provider, MD  calcitRIOL (ROCALTROL) 0.25 MCG capsule Take 0.25 mcg by mouth daily.   Yes Historical Provider, MD  cloNIDine (CATAPRES) 0.3 MG tablet Take 0.3 mg by mouth 2 (two) times daily.   Yes Historical Provider, MD  metoprolol succinate (TOPROL-XL) 50 MG 24 hr tablet Take 50 mg by mouth daily. Take with  or immediately following a meal.   Yes Historical Provider, MD  sodium bicarbonate 650 MG tablet Take 650 mg by mouth 3 (three) times daily.   Yes Historical Provider, MD  torsemide (DEMADEX) 20 MG tablet Take 1 tablet (20 mg total) by mouth daily. Take 2 tablets by mouth once daily 09/22/11  Yes Fayrene Helper, MD  triamcinolone cream (KENALOG) 0.1 % Apply 1 application topically daily as needed (irritation).   Yes Historical Provider, MD  valsartan (DIOVAN) 320 MG  tablet Take 320 mg by mouth daily.   Yes Historical Provider, MD    FAMILY HISTORY: Family History  Problem Relation Age of Onset  . Hypertension Sister   . Hypertension Mother   . Hypertension Father     SOCIAL HISTORY:  reports that he has never smoked. He does not have any smokeless tobacco history on file. He reports that he does not drink alcohol or use illicit drugs.  REVIEW OF SYSTEMS:  Constitutional:   No  weight loss, night sweats,  Fevers, chills, fatigue.  HEENT:    No headaches, Difficulty swallowing,Tooth/dental problems,Sore throat,  No sneezing, itching, ear ache, nasal congestion, post nasal drip,   Cardio-vascular: No chest pain,  Orthopnea, PND,  palpitations  GI:  No heartburn, indigestion, abdominal pain, nausea, vomiting, diarrhea, change inbowel habits, loss of appetite  Resp: No shortness of breath with exertion or at rest.  No excess mucus, no productive cough, No non-productive cough,  No coughing up of blood.No change in color of mucus.No wheezing.No chest wall deformity  Skin:  no rash or lesions.  GU:  no dysuria, change in color of urine, no urgency or frequency.  No flank pain.  Musculoskeletal: No joint pain or swelling.  No decreased range of motion.  No back pain.  Psych: No change in mood or affect. No depression or anxiety.  No memory loss.   PHYSICAL EXAM: Blood pressure 251/77, pulse 89, temperature 98.2 F (36.8 C), temperature source Oral, resp. rate 18, height 5' 6.5" (1.689 m), weight 163.295 kg (360 lb), SpO2 98.00%.  General appearance :Awake and alert- but with generalized weakness, not in any distress. HEENT: Atraumatic and Normocephalic, pupils equally reactive to light and accomodation Neck: supple, no JVD. No cervical lymphadenopathy.  Chest:Good air entry bilaterally, no added sounds  CVS: S1 S2 regular, no murmurs.  Abdomen: Bowel sounds present, Non tender and not distended with no gaurding, rigidity or rebound.  Colostomy in place. Abdomen is very obese. Extremities:  B/L Lower Ext shows significant lymphedema with bilateral chronic venous stasis changes. Neurology: Generalized weakness-but able to move all 4 extremities-nonfocal exam. However given weakness, significant lymphedema very limited exam. Skin:No Rash Wounds:N/A  LABS ON ADMISSION:   Recent Labs  07/31/13 1707  NA 139  K 4.9  CL 105  CO2 18*  GLUCOSE 107*  BUN 38*  CREATININE 4.02*  CALCIUM 9.7    Recent Labs  07/31/13 1707  AST 112*  ALT 23  ALKPHOS 52  BILITOT 1.3*  PROT 7.2  ALBUMIN 3.7   No results found for this basename: LIPASE, AMYLASE,  in the last 72 hours  Recent Labs  07/31/13 1707  WBC 11.2*  NEUTROABS 8.8*  HGB 10.5*  HCT 32.7*  MCV 79.6  PLT 255    Recent Labs  07/31/13 1707  CKTOTAL 4896*   No results found for this basename: DDIMER,  in the last 72 hours No components found with this basename: POCBNP,    RADIOLOGIC  STUDIES ON ADMISSION: Dg Chest 1 View  07/31/2013   CLINICAL DATA:  Morbid obesity, weakness, hypertension  EXAM: CHEST - 1 VIEW  COMPARISON:  August 09, 2011  FINDINGS: The heart size and mediastinal contours are stable. The heart size is enlarged. There is no focal infiltrate, pulmonary edema, or pleural effusion. The visualized skeletal structures are stable.  IMPRESSION: No active cardiopulmonary disease.   Electronically Signed   By: Abelardo Diesel M.D.   On: 07/31/2013 17:22   Ct Head Wo Contrast  07/31/2013   CLINICAL DATA:  Fall last night.  Mental status changes.  Weakness.  EXAM: CT HEAD WITHOUT CONTRAST  CT CERVICAL SPINE WITHOUT CONTRAST  TECHNIQUE: Multidetector CT imaging of the head and cervical spine was performed following the standard protocol without intravenous contrast. Multiplanar CT image reconstructions of the cervical spine were also generated.  COMPARISON:  None  FINDINGS: CT HEAD FINDINGS  Sinuses/Soft tissues: Motion degradation, despite 2 attempts.  No gross soft tissue swelling. Mild hyperostosis frontalis interna, without skull fracture. Clear paranasal sinuses and mastoid air cells.  Intracranial: Given motion degradation, no mass lesion, hemorrhage, hydrocephalus, acute infarct, intra-axial, or extra-axial fluid collection.  CT CERVICAL SPINE FINDINGS  Spinal visualization through the bottom of T2. Mild to moderate motion and patient body habitus degradation. Prevertebral soft tissues grossly within normal limits. Dense carotid atherosclerosis bilaterally. No apical pneumothorax. Multilevel spondylosis. This results an neural foraminal narrowing, greater on the left than right. Examples at C4-5 and C5-6.  Skull base grossly intact. Apparent rotation of C1 relative to C2 is favored to be positional. Vertebral body height grossly maintained. Straightening and mild reversal of expected cervical lordosis. Facets are grossly aligned. Coronal reformats demonstrate a normal C1-C2 articulation.  IMPRESSION: 1. Motion and patient body habitus degraded exams. 2. Given this factor, no convincing evidence of acute fracture or subluxation throughout the cervical spine. No convincing evidence of acute intracranial abnormality. 3. Advanced spondylosis, with areas of bilateral neural foraminal narrowing. 4. Straightening of expected cervical lordosis could be positional, due to muscular spasm, or ligamentous injury.   Electronically Signed   By: Abigail Miyamoto M.D.   On: 07/31/2013 17:54   Ct Cervical Spine Wo Contrast  07/31/2013   CLINICAL DATA:  Fall last night.  Mental status changes.  Weakness.  EXAM: CT HEAD WITHOUT CONTRAST  CT CERVICAL SPINE WITHOUT CONTRAST  TECHNIQUE: Multidetector CT imaging of the head and cervical spine was performed following the standard protocol without intravenous contrast. Multiplanar CT image reconstructions of the cervical spine were also generated.  COMPARISON:  None  FINDINGS: CT HEAD FINDINGS  Sinuses/Soft tissues: Motion  degradation, despite 2 attempts. No gross soft tissue swelling. Mild hyperostosis frontalis interna, without skull fracture. Clear paranasal sinuses and mastoid air cells.  Intracranial: Given motion degradation, no mass lesion, hemorrhage, hydrocephalus, acute infarct, intra-axial, or extra-axial fluid collection.  CT CERVICAL SPINE FINDINGS  Spinal visualization through the bottom of T2. Mild to moderate motion and patient body habitus degradation. Prevertebral soft tissues grossly within normal limits. Dense carotid atherosclerosis bilaterally. No apical pneumothorax. Multilevel spondylosis. This results an neural foraminal narrowing, greater on the left than right. Examples at C4-5 and C5-6.  Skull base grossly intact. Apparent rotation of C1 relative to C2 is favored to be positional. Vertebral body height grossly maintained. Straightening and mild reversal of expected cervical lordosis. Facets are grossly aligned. Coronal reformats demonstrate a normal C1-C2 articulation.  IMPRESSION: 1. Motion and patient body habitus degraded exams. 2.  Given this factor, no convincing evidence of acute fracture or subluxation throughout the cervical spine. No convincing evidence of acute intracranial abnormality. 3. Advanced spondylosis, with areas of bilateral neural foraminal narrowing. 4. Straightening of expected cervical lordosis could be positional, due to muscular spasm, or ligamentous injury.   Electronically Signed   By: Abigail Miyamoto M.D.   On: 07/31/2013 17:54     EKG: Independently reviewed. NSR  ASSESSMENT AND PLAN: Present on Admission:  . Rhabdomyolysis - Secondary to fall and being immobilized for a prolonged period of time.  - Although, patient is volume overloaded on exam, we'll stop all diuretics and hydrate cautiously. Recheck electrolytes and CPK in a.m.   . Fall - Suspected mechanical fall from the history. - CT head and CT  C spine negative - Unfortunately has significant and severe  lymphedema of bilateral lower extremities, and has essentially stopped ambulating for approximately one month prior to this admission. - Likely will need SNF placement on discharge - PT/OT eval  . Chronic kidney disease, stage IV (severe) - Creatinine close to usual baseline.  - Follows with Dr.  Hinda Lenis - Per patient's sister, patient does not desire hemodialysis if further renal function worsens.  - Will need to notify patient's primary nephrologist of this admission   . Uncontrolled hypertension  - Hold diuretics for now as we are attempting to hydrate the patient for rhabdomyolysis.  - Also hold valsartan for now given advanced to renal disease  - Increase clonidine to 0.3 mg by mouth 3 times a day, continue with Toprol XL 50 mg daily, would add as needed IV hydralazine for now. BP will be monitored closely and further adjustment will be made.   Marland Kitchen HYPERURICEMIA - No evidence of gout flare. Continue allopurinol.   . OBESITY, MORBID - Counseled regarding the importance of weight loss   . lower extremity lymphedema - Significant chronic venous stasis changes/elephantiasis of bilateral lower extremities. Previously used to be seen wound care clinic.   . Metabolic acidosis - Secondary to chronic kidney disease. Continue with oral bicarbonate  . Social issues - Not ambulating, significant medical comorbidities-lives alone, and now with falls. Will benefit SNF placement on discharge. - Consult social work. Please note, the patient's sister-Ms. [name] Snow daughter is currently working on getting the patient to a skilled nursing facility-her name is [name] Snow number [phone] (extension [phone]). Social worker will need to contact Ms. Caple.  Further plan will depend as patient's clinical course evolves and further radiologic and laboratory data become available. Patient will be monitored closely.  Above noted plan was discussed with patient/sister-[name], they were in  agreement.   DVT Prophylaxis:  Heparin  Code Status: DNR-confirmed with sister/HCPOA [name] Snow [id number]. Per Ms Hassell Snow- upon her recent visit with patient's primary care practitioner, this was discussed, and patient has elected to be a DO NOT RESUSCITATE.   Total time spent for admission equals 45 minutes.  [facility] Pager [phone]  If 7PM-7AM, please contact night-coverage [email or web address] Password [email or web address] 07/31/2013, 8:29 PM

## 2013-07-31 NOTE — ED Provider Notes (Signed)
CSN: [id number]     Arrival date & time 07/31/13  1516 History  This chart was scribed for Richarda Blade, MD by Celesta Gentile, ED Scribe. The patient was seen in room APA10/APA10. Patient's care was started at 6:44 PM.    Chief Complaint  Patient presents with  . Leg Swelling  Level 5 Caveat - Altered Mental Status  The history is provided by the patient. No language interpreter was used.   HPI Comments: [name] Snow is a 72 y.o. male who presents to the Emergency Department complaining of leg swelling.  Pt was found on the floor by EMS.  Sister states that he fell last night at his apartment in assisted living, but she found him this morning.  She suspects that he was on the floor since the fall until she found him.  Sister states that the pt's ability to ambulate himself has declined leading up to this fall.  Sister states that the pt's inability to converse is normally.  Sister states he hasn't moved from his chair in about 2 weeks.  Sister states pt has a colostomy that he changes himself.  He urinates regularly in a cup while sitting in his chair. He is usually able to ambulate on his own, in his apartment, to fix his food.    Past Medical History  Diagnosis Date  . Chronic pancreatitis   . Hyperlipidemia   . Colostomy in place   . Intestinal obstruction   . Alcohol abuse   . Morbid obesity   . Hypertension   . Dialysis care   . Metabolic acidosis [id number]   Past Surgical History  Procedure Laterality Date  . Colectomy    . Partial toenail removal  of the right great toe secondary to recurrent infection from ingrown toenail     Family History  Problem Relation Age of Onset  . Hypertension Sister   . Hypertension Mother   . Hypertension Father    History  Substance Use Topics  . Smoking status: Never Smoker   . Smokeless tobacco: Not on file  . Alcohol Use: No    Review of Systems  Unable to perform ROS Constitutional: Negative for fever and chills.  HENT:  Negative for congestion and rhinorrhea.   Respiratory: Negative for cough and shortness of breath.   Cardiovascular: Negative for chest pain.  Gastrointestinal: Negative for nausea, vomiting, abdominal pain and diarrhea.  Musculoskeletal: Negative for back pain.  Skin: Negative for color change and rash.  Neurological: Negative for syncope.    Allergies  Review of patient's allergies indicates no known allergies.  Home Medications   Current Outpatient Rx  Name  Route  Sig  Dispense  Refill  . allopurinol (ZYLOPRIM) 300 MG tablet   Oral   Take 300 mg by mouth daily.         . calcitRIOL (ROCALTROL) 0.25 MCG capsule   Oral   Take 0.25 mcg by mouth daily.         . cloNIDine (CATAPRES) 0.3 MG tablet   Oral   Take 0.3 mg by mouth 2 (two) times daily.         Marland Kitchen KIONEX 15 GM/60ML suspension               . levothyroxine (SYNTHROID) 50 MCG tablet   Oral   Take 1 tablet (50 mcg total) by mouth daily.   30 tablet   11   . metoprolol succinate (TOPROL-XL) 50 MG 24 hr tablet  Oral   Take 50 mg by mouth daily. Take with or immediately following a meal.         . sodium bicarbonate 650 MG tablet   Oral   Take 650 mg by mouth 3 (three) times daily.         Marland Kitchen spironolactone (ALDACTONE) 25 MG tablet   Oral   Take 1 tablet (25 mg total) by mouth daily.   30 tablet   4   . torsemide (DEMADEX) 20 MG tablet   Oral   Take 1 tablet (20 mg total) by mouth daily. Take 2 tablets by mouth once daily   60 tablet   3   . traMADol (ULTRAM) 50 MG tablet   Oral   Take 1 tablet (50 mg total) by mouth every 6 (six) hours as needed.   20 tablet   0   . triamcinolone cream (KENALOG) 0.1 %      APPLY AS DIRECTED   454 g   0   . valsartan (DIOVAN) 320 MG tablet   Oral   Take 320 mg by mouth daily.          Triage Vitals: BP 177/88  Pulse 86  Temp(Src) 98.2 F (36.8 C) (Oral)  Resp 28  Ht 5' 6.5" (1.689 m)  Wt 360 lb (163.295 kg)  BMI 57.24 kg/m2  SpO2  98% Physical Exam  Nursing note and vitals reviewed. Constitutional: He appears well-developed.  Morbidly obese.  HENT:  Head: Normocephalic and atraumatic.  Right Ear: External ear normal.  Left Ear: External ear normal.  Eyes: Conjunctivae and EOM are normal. Pupils are equal, round, and reactive to light.  Neck: Normal range of motion and phonation normal. Neck supple.  Cardiovascular: Normal rate, regular rhythm, normal heart sounds and intact distal pulses.   Pulmonary/Chest: Effort normal and breath sounds normal. He exhibits no bony tenderness.  Abdominal: Soft. Normal appearance. There is no tenderness.  Musculoskeletal: Normal range of motion. He exhibits edema.  Swelling of arms and legs Massive edema of lower extremities Mild pain with ROM of both legs No deformity on both legs  Neurological: He is alert. No sensory deficit.  Follows simple commands. He is able to elevate both arms, right strength greater than left. He, essentially has no active movement of the legs.  Skin: Skin is warm, dry and intact.  Small excoriation on left ankle that is bleeding. Numerous moderate-sized blisters on both legs, consistent with severe edema as the source.  Psychiatric: He has a normal mood and affect. His behavior is normal.    ED Course  Procedures (including critical care time)  Patient Vitals for the past 24 hrs:  BP Temp Temp src Pulse Resp SpO2 Height Weight  07/31/13 1812 251/77 mmHg - - 89 18 98 % - -  07/31/13 1524 - 98.2 F (36.8 C) Oral 86 28 98 % 5' 6.5" (1.689 m) 360 lb (163.295 kg)  07/31/13 1517 177/88 mmHg 99.3 F (37.4 C) Oral 91 24 100 % - -   Medications  0.9 %  sodium chloride infusion (not administered)    DIAGNOSTIC STUDIES: Oxygen Saturation is 100% on RA, normal by my interpretation.    COORDINATION OF CARE: 6:44 PM-Patient informed of current plan of treatment and evaluation and agrees with plan.    Labs Review Labs Reviewed  CBC WITH  DIFFERENTIAL - Abnormal; Notable for the following:    WBC 11.2 (*)    RBC 4.11 (*)  Hemoglobin 10.5 (*)    HCT 32.7 (*)    MCH 25.5 (*)    RDW 15.8 (*)    Neutrophils Relative % 79 (*)    Neutro Abs 8.8 (*)    All other components within normal limits  URINALYSIS, ROUTINE W REFLEX MICROSCOPIC - Abnormal; Notable for the following:    Hgb urine dipstick LARGE (*)    Ketones, ur TRACE (*)    Protein, ur >300 (*)    All other components within normal limits  COMPREHENSIVE METABOLIC PANEL - Abnormal; Notable for the following:    CO2 18 (*)    Glucose, Bld 107 (*)    BUN 38 (*)    Creatinine, Ser 4.02 (*)    AST 112 (*)    Total Bilirubin 1.3 (*)    GFR calc non Af Amer 14 (*)    GFR calc Af Amer 16 (*)    All other components within normal limits  CK - Abnormal; Notable for the following:    Total CK 4896 (*)    All other components within normal limits  URINE MICROSCOPIC-ADD ON - Abnormal; Notable for the following:    Bacteria, UA FEW (*)    Casts HYALINE CASTS (*)    All other components within normal limits  URINE CULTURE   Imaging Review Dg Chest 1 View  07/31/2013   CLINICAL DATA:  Morbid obesity, weakness, hypertension  EXAM: CHEST - 1 VIEW  COMPARISON:  August 09, 2011  FINDINGS: The heart size and mediastinal contours are stable. The heart size is enlarged. There is no focal infiltrate, pulmonary edema, or pleural effusion. The visualized skeletal structures are stable.  IMPRESSION: No active cardiopulmonary disease.   Electronically Signed   By: Abelardo Diesel M.D.   On: 07/31/2013 17:22   Ct Head Wo Contrast  07/31/2013   CLINICAL DATA:  Fall last night.  Mental status changes.  Weakness.  EXAM: CT HEAD WITHOUT CONTRAST  CT CERVICAL SPINE WITHOUT CONTRAST  TECHNIQUE: Multidetector CT imaging of the head and cervical spine was performed following the standard protocol without intravenous contrast. Multiplanar CT image reconstructions of the cervical spine were also  generated.  COMPARISON:  None  FINDINGS: CT HEAD FINDINGS  Sinuses/Soft tissues: Motion degradation, despite 2 attempts. No gross soft tissue swelling. Mild hyperostosis frontalis interna, without skull fracture. Clear paranasal sinuses and mastoid air cells.  Intracranial: Given motion degradation, no mass lesion, hemorrhage, hydrocephalus, acute infarct, intra-axial, or extra-axial fluid collection.  CT CERVICAL SPINE FINDINGS  Spinal visualization through the bottom of T2. Mild to moderate motion and patient body habitus degradation. Prevertebral soft tissues grossly within normal limits. Dense carotid atherosclerosis bilaterally. No apical pneumothorax. Multilevel spondylosis. This results an neural foraminal narrowing, greater on the left than right. Examples at C4-5 and C5-6.  Skull base grossly intact. Apparent rotation of C1 relative to C2 is favored to be positional. Vertebral body height grossly maintained. Straightening and mild reversal of expected cervical lordosis. Facets are grossly aligned. Coronal reformats demonstrate a normal C1-C2 articulation.  IMPRESSION: 1. Motion and patient body habitus degraded exams. 2. Given this factor, no convincing evidence of acute fracture or subluxation throughout the cervical spine. No convincing evidence of acute intracranial abnormality. 3. Advanced spondylosis, with areas of bilateral neural foraminal narrowing. 4. Straightening of expected cervical lordosis could be positional, due to muscular spasm, or ligamentous injury.   Electronically Signed   By: Abigail Miyamoto M.D.   On: 07/31/2013 17:54  Ct Cervical Spine Wo Contrast  07/31/2013   CLINICAL DATA:  Fall last night.  Mental status changes.  Weakness.  EXAM: CT HEAD WITHOUT CONTRAST  CT CERVICAL SPINE WITHOUT CONTRAST  TECHNIQUE: Multidetector CT imaging of the head and cervical spine was performed following the standard protocol without intravenous contrast. Multiplanar CT image reconstructions of the  cervical spine were also generated.  COMPARISON:  None  FINDINGS: CT HEAD FINDINGS  Sinuses/Soft tissues: Motion degradation, despite 2 attempts. No gross soft tissue swelling. Mild hyperostosis frontalis interna, without skull fracture. Clear paranasal sinuses and mastoid air cells.  Intracranial: Given motion degradation, no mass lesion, hemorrhage, hydrocephalus, acute infarct, intra-axial, or extra-axial fluid collection.  CT CERVICAL SPINE FINDINGS  Spinal visualization through the bottom of T2. Mild to moderate motion and patient body habitus degradation. Prevertebral soft tissues grossly within normal limits. Dense carotid atherosclerosis bilaterally. No apical pneumothorax. Multilevel spondylosis. This results an neural foraminal narrowing, greater on the left than right. Examples at C4-5 and C5-6.  Skull base grossly intact. Apparent rotation of C1 relative to C2 is favored to be positional. Vertebral body height grossly maintained. Straightening and mild reversal of expected cervical lordosis. Facets are grossly aligned. Coronal reformats demonstrate a normal C1-C2 articulation.  IMPRESSION: 1. Motion and patient body habitus degraded exams. 2. Given this factor, no convincing evidence of acute fracture or subluxation throughout the cervical spine. No convincing evidence of acute intracranial abnormality. 3. Advanced spondylosis, with areas of bilateral neural foraminal narrowing. 4. Straightening of expected cervical lordosis could be positional, due to muscular spasm, or ligamentous injury.   Electronically Signed   By: Abigail Miyamoto M.D.   On: 07/31/2013 17:54    EKG Interpretation    Date/Time:  Monday July 31 2013 18:01:38 EST Ventricular Rate:  94 PR Interval:  138 QRS Duration: 86 QT Interval:  334 QTC Calculation: 417 R Axis:   0 Text Interpretation:  Normal sinus rhythm Possible Left atrial enlargement Borderline ECG When compared with ECG of 15-Sep-2010 08:04, No significant change  was found Confirmed by Rafeef Lau  MD, Hollister Wessler ([id number]) on 07/31/2013 6:39:01 PM            MDM   1. Fall, initial encounter   2. Weakness   3. Obesity   4. Peripheral edema   5. Rhabdomyolysis       Fall without clear evidence for injury, associated with severe weakness, global, but no localizing signs or concern for acute CVA, on imaging. There is no apparent immediately reversible process to improve his strength. He will need hydration, and monitoring of renal function, secondary to the rhabdomyolysis, which is suspected to be acute. Urine hemoglobin is large,  with only 5-6 RBCs per high-power field, consistent with myoglobinuria. He'll require evaluation, in the observed setting of the hospital; to terminate cause for his weakness, and assure that there was no injury, in the fall.    Nursing Notes Reviewed/ Care Coordinated, and agree without changes. Applicable Imaging Reviewed.  Interpretation of Laboratory Data incorporated into ED treatment  Plan: Admit    I personally performed the services described in this documentation, which was scribed in my presence. The recorded information has been reviewed and is accurate.     Richarda Blade, MD 07/31/13 2209

## 2013-07-31 NOTE — Telephone Encounter (Signed)
Noted and agree. 

## 2013-08-01 ENCOUNTER — Encounter (HOSPITAL_COMMUNITY): Payer: Self-pay | Admitting: Cardiology

## 2013-08-01 DIAGNOSIS — R600 Localized edema: Secondary | ICD-10-CM | POA: Diagnosis present

## 2013-08-01 DIAGNOSIS — R609 Edema, unspecified: Secondary | ICD-10-CM

## 2013-08-01 DIAGNOSIS — Y92009 Unspecified place in unspecified non-institutional (private) residence as the place of occurrence of the external cause: Secondary | ICD-10-CM

## 2013-08-01 DIAGNOSIS — W19XXXA Unspecified fall, initial encounter: Secondary | ICD-10-CM

## 2013-08-01 DIAGNOSIS — IMO0002 Reserved for concepts with insufficient information to code with codable children: Secondary | ICD-10-CM

## 2013-08-01 DIAGNOSIS — L03115 Cellulitis of right lower limb: Secondary | ICD-10-CM | POA: Diagnosis present

## 2013-08-01 DIAGNOSIS — R079 Chest pain, unspecified: Secondary | ICD-10-CM | POA: Diagnosis not present

## 2013-08-01 DIAGNOSIS — I10 Essential (primary) hypertension: Secondary | ICD-10-CM

## 2013-08-01 DIAGNOSIS — L02419 Cutaneous abscess of limb, unspecified: Secondary | ICD-10-CM

## 2013-08-01 DIAGNOSIS — D631 Anemia in chronic kidney disease: Secondary | ICD-10-CM | POA: Diagnosis present

## 2013-08-01 LAB — COMPREHENSIVE METABOLIC PANEL
AST: 86 U/L — ABNORMAL HIGH (ref 0–37)
Albumin: 3.1 g/dL — ABNORMAL LOW (ref 3.5–5.2)
Calcium: 9.1 mg/dL (ref 8.4–10.5)
Chloride: 107 mEq/L (ref 96–112)
Creatinine, Ser: 4.03 mg/dL — ABNORMAL HIGH (ref 0.50–1.35)
GFR calc Af Amer: 16 mL/min — ABNORMAL LOW (ref >90)

## 2013-08-01 LAB — CBC
HCT: 29.9 % — ABNORMAL LOW (ref 39.0–52.0)
MCH: 25.5 pg — ABNORMAL LOW (ref 26.0–34.0)
MCHC: 32.1 g/dL (ref 30.0–36.0)
MCV: 79.3 fL (ref 78.0–100.0)
Platelets: 218 10*3/uL (ref 150–400)
RBC: 3.77 MIL/uL — ABNORMAL LOW (ref 4.22–5.81)
RDW: 15.7 % — ABNORMAL HIGH (ref 11.5–15.5)
WBC: 8.6 10*3/uL (ref 4.0–10.5)

## 2013-08-01 LAB — URINE CULTURE
Colony Count: NO GROWTH
Culture: NO GROWTH

## 2013-08-01 LAB — TROPONIN I
Troponin I: 0.34 ng/mL (ref <0.30)
Troponin I: <0.3 ng/mL (ref <0.30)
Troponin I: <0.3 ng/mL (ref <0.30)

## 2013-08-01 LAB — VITAMIN B12: Vitamin B-12: 637 pg/mL (ref 211–911)

## 2013-08-01 LAB — RETICULOCYTES
RBC.: 3.74 MIL/uL — ABNORMAL LOW (ref 4.22–5.81)
Retic Count, Absolute: 71.1 10*3/uL (ref 19.0–186.0)

## 2013-08-01 LAB — GLUCOSE, CAPILLARY: Glucose-Capillary: 88 mg/dL (ref 70–99)

## 2013-08-01 LAB — TSH: TSH: 2.055 u[IU]/mL (ref 0.350–4.500)

## 2013-08-01 LAB — T4, FREE: Free T4: 0.84 ng/dL (ref 0.80–1.80)

## 2013-08-01 LAB — IRON AND TIBC
Iron: 23 ug/dL — ABNORMAL LOW (ref 42–135)
Saturation Ratios: 14 % — ABNORMAL LOW (ref 20–55)

## 2013-08-01 LAB — FOLATE: Folate: 10.9 ng/mL

## 2013-08-01 MED ORDER — ALBUTEROL SULFATE (2.5 MG/3ML) 0.083% IN NEBU
2.5000 mg | INHALATION_SOLUTION | Freq: Four times a day (QID) | RESPIRATORY_TRACT | Status: DC
  Administered 2013-08-01 (×2): 2.5 mg via RESPIRATORY_TRACT
  Filled 2013-08-01 (×2): qty 3

## 2013-08-01 MED ORDER — FUROSEMIDE 10 MG/ML IJ SOLN
40.0000 mg | Freq: Two times a day (BID) | INTRAMUSCULAR | Status: DC
  Administered 2013-08-01 – 2013-08-02 (×3): 40 mg via INTRAVENOUS
  Filled 2013-08-01 (×3): qty 4

## 2013-08-01 MED ORDER — VANCOMYCIN HCL 10 G IV SOLR
2000.0000 mg | INTRAVENOUS | Status: DC
  Filled 2013-08-01: qty 2000

## 2013-08-01 MED ORDER — VANCOMYCIN HCL 10 G IV SOLR
2000.0000 mg | Freq: Once | INTRAVENOUS | Status: AC
  Administered 2013-08-01: 2000 mg via INTRAVENOUS
  Filled 2013-08-01: qty 2000

## 2013-08-01 MED ORDER — LABETALOL HCL 5 MG/ML IV SOLN: 10.0000 mg | INTRAVENOUS | Status: DC | PRN

## 2013-08-01 MED ORDER — MORPHINE SULFATE 2 MG/ML IJ SOLN
1.0000 mg | Freq: Once | INTRAMUSCULAR | Status: AC
  Administered 2013-08-01: 1 mg via INTRAVENOUS
  Filled 2013-08-01: qty 1

## 2013-08-01 MED ORDER — AMLODIPINE BESYLATE 5 MG PO TABS
10.0000 mg | ORAL_TABLET | Freq: Every day | ORAL | Status: DC
  Administered 2013-08-01: 10 mg via ORAL
  Filled 2013-08-01: qty 2

## 2013-08-01 MED ORDER — EPOETIN ALFA 10000 UNIT/ML IJ SOLN: 8000.0000 [IU] | Freq: Once | INTRAMUSCULAR | Status: DC

## 2013-08-01 MED ORDER — METOPROLOL SUCCINATE ER 50 MG PO TB24
100.0000 mg | ORAL_TABLET | Freq: Every day | ORAL | Status: DC
  Administered 2013-08-01 – 2013-08-02 (×2): 100 mg via ORAL
  Filled 2013-08-01 (×2): qty 2

## 2013-08-01 MED ORDER — ASPIRIN 325 MG PO TABS
325.0000 mg | ORAL_TABLET | Freq: Every day | ORAL | Status: DC
  Administered 2013-08-01 – 2013-08-02 (×2): 325 mg via ORAL
  Filled 2013-08-01 (×2): qty 1

## 2013-08-01 MED ORDER — HYDRALAZINE HCL 25 MG PO TABS
25.0000 mg | ORAL_TABLET | Freq: Three times a day (TID) | ORAL | Status: DC
  Administered 2013-08-01 – 2013-08-02 (×3): 25 mg via ORAL
  Filled 2013-08-01 (×3): qty 1

## 2013-08-01 MED ORDER — ISOSORBIDE MONONITRATE ER 60 MG PO TB24
30.0000 mg | ORAL_TABLET | Freq: Every day | ORAL | Status: DC
  Administered 2013-08-01 – 2013-08-02 (×2): 30 mg via ORAL
  Filled 2013-08-01 (×2): qty 1

## 2013-08-01 NOTE — Progress Notes (Signed)
Patient complained of chest pain.  EKG done. MD paged.

## 2013-08-01 NOTE — Progress Notes (Signed)
UR chart review completed.  

## 2013-08-01 NOTE — Progress Notes (Addendum)
CRITICAL VALUE ALERT  Critical value received:  Troponin 0.34  Date of notification:  08/01/13   Time of notification:  0400  Critical value read back: yes  Nurse who received alert:  Sharyn Creamer, RN  MD notified (1st page):  Ghirime  Time of first page:  0400  MD notified (2nd page):  Time of second page:  Responding MD:  Marya Amsler  Time MD responded:  [phone]

## 2013-08-01 NOTE — Progress Notes (Signed)
Minimal troponin elevation, chest pain resolved-continue to monitor in telemetry. On ASA. Trend troponin.

## 2013-08-01 NOTE — Progress Notes (Signed)
ANTIBIOTIC CONSULT NOTE - INITIAL  Pharmacy Consult for Vancomycin  Indication: cellulitis  No Known Allergies  Patient Measurements: Height: 5\' 6"  (167.6 cm) Weight: 321 lb 10.4 oz (145.9 kg) IBW/kg (Calculated) : 63.8  Vital Signs: Temp: 98.3 F (36.8 C) (12/30 0524) Temp src: Oral (12/30 0524) BP: 201/79 mmHg (12/30 0524) Pulse Rate: 76 (12/30 0524) Intake/Output from previous day: 12/29 0701 - 12/30 0700 In: -  Out: 875 [Urine:850; Stool:25] Intake/Output from this shift:    Labs:  Recent Labs  07/31/13 1707 08/01/13 0240  WBC 11.2* 8.6  HGB 10.5* 9.6*  PLT 255 218  CREATININE 4.02* 4.03*   Estimated Creatinine Clearance: 22.6 ml/min (by C-G formula based on Cr of 4.03). No results found for this basename: VANCOTROUGH, VANCOPEAK, VANCORANDOM, GENTTROUGH, GENTPEAK, GENTRANDOM, TOBRATROUGH, TOBRAPEAK, TOBRARND, AMIKACINPEAK, AMIKACINTROU, AMIKACIN,  in the last 72 hours   Microbiology: No results found for this or any previous visit (from the past 720 hour(s)).  Medical History: Past Medical History  Diagnosis Date  . Chronic pancreatitis   . Hyperlipidemia   . Colostomy in place   . Intestinal obstruction   . Alcohol abuse   . Morbid obesity   . Hypertension   . Dialysis care   . Metabolic acidosis [id number]    Medications:  Scheduled:  . allopurinol  300 mg Oral Daily  . amLODipine  10 mg Oral Daily  . aspirin  325 mg Oral Daily  . calcitRIOL  0.25 mcg Oral Daily  . cloNIDine  0.3 mg Oral Q8H  . furosemide  40 mg Intravenous BID  . heparin  5,000 Units Subcutaneous Q8H  . hydrALAZINE  25 mg Oral Q8H  . isosorbide mononitrate  30 mg Oral Daily  . metoprolol succinate  100 mg Oral Daily  . sodium bicarbonate  650 mg Oral TID  . sodium chloride  3 mL Intravenous Q12H  . vancomycin  2,000 mg Intravenous Once   Assessment: 72 yo obese M admitted with cellulitis.   CKD noted.  Normalized CrCl 15-1ml/min.  Vancomycin 12/30>>  Goal of Therapy:   Vancomycin trough level 10-15 mcg/ml  Plan:  Vancomycin 2gm IV q48h Check Vancomycin trough at steady state Monitor renal function and cx data   Biagio Borg 08/01/2013,10:26 AM

## 2013-08-01 NOTE — Evaluation (Signed)
Occupational Therapy Evaluation Patient Details Name: KACPER MALCOMSON MRN: [id number] DOB: 08/05/1940 Today's Date: 08/01/2013 Time: [id number] OT Time Calculation (min): 23 min Evaluation [id number] (55')  OT Assessment / Plan / Recommendation History of present illness A morbidly obese male was admitted with rhabdomyolysis after falling at home and lying on the floor for an extended period of time.  He has multiple medical illnesses including stage 4 remnal failure, permanent colostomy and chronic LE lymphedema.  He had not been strong enough to ambulate for the past month and has been functioning out of a w/c, able to transfer independently with a cane.  He had family assist with feeding and bathing.  He is expressively limited but is oriented and appropriate.   Clinical Impression   Patient with significant decreased independence with ADL activities, functional mobility and overall strength.  Recommend patient would benefit from skilled OT services to maximize functional independence and potential .    OT Assessment  Patient needs continued OT Services    Follow Up Recommendations  SNF    Barriers to Discharge Decreased caregiver support    Equipment Recommendations       Recommendations for Other Services Rehab consult  Frequency  Min 5X/week    Precautions / Restrictions Precautions Precautions: Fall Restrictions Weight Bearing Restrictions: No   Pertinent Vitals/Pain     ADL  Eating/Feeding: Set up;Min guard Where Assessed - Eating/Feeding: Bed level Lower Body Dressing: +2 Total assistance ADL Comments: limited ADL assessment; limited ability to perform secondary to weakness and declined at eval    OT Diagnosis: Generalized weakness  OT Problem List: Decreased strength;Decreased range of motion;Decreased knowledge of use of DME or AE;Increased edema;Decreased coordination;Obesity;Decreased activity tolerance;Impaired UE functional use;Impaired balance (sitting and/or  standing);Decreased safety awareness OT Treatment Interventions: Self-care/ADL training;Balance training;Therapeutic exercise;Neuromuscular education;Therapeutic activities;Energy conservation;DME and/or AE instruction;Patient/family education   OT Goals(Current goals can be found in the care plan section) Acute Rehab OT Goals Patient Stated Goal: none stated Potential to Achieve Goals: Fair ADL Goals Pt Will Perform Grooming: with min assist;sitting;bed level Pt Will Perform Upper Body Bathing: with mod assist;sitting;bed level Pt/caregiver will Perform Home Exercise Program: Increased ROM;Increased strength;Both right and left upper extremity;With minimal assist  Visit Information  History of Present Illness: A morbidly obese male was admitted with rhabdomyolysis after falling at home and lying on the floor for an extended period of time.  He has multiple medical illnesses including stage 4 remnal failure, permanent colostomy and chronic LE lymphedema.  He had not been strong enough to ambulate for the past month and has been functioning out of a w/c, able to transfer independently with a cane.  He had family assist with feeding and bathing.  He is expressively limited but is oriented and appropriate.       Prior [place] expects to be discharged to:: Skilled nursing facility Living Arrangements: Alone Prior Function Level of Independence: Needs assistance (reports sisters assist with I/BADL tasks) Gait / Transfers Assistance Needed: transfers independently with a cane, unable to walk for last month per documentation ADL's / Homemaking Assistance Needed: assist needed with getting food, bathing, dressing and all IADL tasks  Comments: patient lives an apartment with no steps; tub/shower combo.  Patient has can, walker and shower chair at home Communication Communication: Expressive difficulties Dominant Hand: Right         Vision/Perception  Vision - History Patient Visual Report: No change from baseline Vision - Assessment Eye  Alignment: Within Functional Limits   Cognition  Cognition Arousal/Alertness: Awake/alert Behavior During Therapy: WFL for tasks assessed/performed Overall Cognitive Status: Within Functional Limits for tasks assessed    Extremity/Trunk Assessment Upper Extremity Assessment Upper Extremity Assessment:  (patient with moderate edema bilateral upper extremities.) LUE Deficits / Details: dialysis port in LUE with limited proximal functional range to >45 degree shoulder flexion  Lower Extremity Assessment Lower Extremity Assessment: Defer to PT evaluation     Mobility Bed Mobility Bed Mobility: Supine to Sit Supine to Sit: 2: Max assist (patient unable to reach for rails upon request during this  assessment ) Transfers Transfers:  (patient declined during this eval ) Sit to Stand: 3: Mod assist;With upper extremity assist;From bed Stand to Sit: 3: Mod assist;With upper extremity assist;To bed;To chair/3-in-1 Details for Transfer Assistance: pt used cane to assist transfer bed to chair...transfer is extremely labored and he had difficulty completing the transfer before sitting.  He had great difficulty repositioning himself in the chair.  A sling was placed in the chair to assist in returning pt to the bed     Exercise General Exercises - Lower Extremity Ankle Circles/Pumps: AROM;Both;10 reps;Supine Short Arc Quad: AROM;Both;10 reps;Supine Heel Slides: AAROM;Both;10 reps;Supine Hip ABduction/ADduction: AAROM;Both;10 reps;Supine   Balance Balance Balance Assessed: Yes Static Sitting Balance Static Sitting - Balance Support: Left upper extremity supported Static Sitting - Level of Assistance: 6: Modified independent (Device/Increase time)   End of Session OT - End of Session Activity Tolerance: Patient tolerated treatment well;Patient limited by fatigue Patient left: in bed  GO     Donney Rankins, OTR/L 08/01/2013, 1:37 PM

## 2013-08-01 NOTE — Consult Note (Signed)
LEODEGARIO WARSHAW MRN: [id number] DOB/AGE: 1940/08/31 72 y.o. Primary [place], MD Admit date: 07/31/2013 Chief Complaint:  Chief Complaint  Patient presents with  . Leg Swelling   HPI:Pt is 72 year old male with past medical hx of CKD stage 4 who came to Er with c/o Fall.  HPI dates back to past few weeks.pt has been in declining health over past few weeks Pt c/o generalized weakness. Pt ambulation has been decreasing over this time. Pt and family were working with pcp to help w SNF placement. On 07/30/13 pt had a fall  When pt was attempting to go to the bathroom Pt denies Syncope. Pt was on the floor whole day and was subsequently found by the patient's sister next morning. Patient is a  poor historian and has speech impediment . Pt does not offer any specific complaints No c/o chest pain No c/o fever/cough/Chills  No c/o nausea/ vomiting No c/o abdominal pain.    Past Medical History  Diagnosis Date  . Chronic pancreatitis   . Hyperlipidemia   . Colostomy in place   . Intestinal obstruction   . Alcohol abuse   . Morbid obesity   . Hypertension   . Dialysis care   . Metabolic acidosis [id number]       Family History  Problem Relation Age of Onset  . Hypertension Sister   . Hypertension Mother   . Hypertension Father     Social History:  reports that he has never smoked. He does not have any smokeless tobacco history on file. He reports that he does not drink alcohol or use illicit drugs.  Allergies: No Known Allergies  Medications Prior to Admission  Medication Sig Dispense Refill  . allopurinol (ZYLOPRIM) 300 MG tablet Take 300 mg by mouth daily.      . calcitRIOL (ROCALTROL) 0.25 MCG capsule Take 0.25 mcg by mouth daily.      . cloNIDine (CATAPRES) 0.3 MG tablet Take 0.3 mg by mouth 2 (two) times daily.      . metoprolol succinate (TOPROL-XL) 50 MG 24 hr tablet Take 50 mg by mouth daily. Take with or immediately following a meal.       . sodium bicarbonate 650 MG tablet Take 650 mg by mouth 3 (three) times daily.      Marland Kitchen torsemide (DEMADEX) 20 MG tablet Take 1 tablet (20 mg total) by mouth daily. Take 2 tablets by mouth once daily  60 tablet  3  . triamcinolone cream (KENALOG) 0.1 % Apply 1 application topically daily as needed (irritation).      . valsartan (DIOVAN) 320 MG tablet Take 320 mg by mouth daily.           [id number] from the symptoms mentioned above,there are no other symptoms referable to all systems reviewed.  Marland Kitchen albuterol  2.5 mg Nebulization Q6H  . allopurinol  300 mg Oral Daily  . amLODipine  10 mg Oral Daily  . aspirin  325 mg Oral Daily  . calcitRIOL  0.25 mcg Oral Daily  . cloNIDine  0.3 mg Oral Q8H  . furosemide  40 mg Intravenous BID  . heparin  5,000 Units Subcutaneous Q8H  . hydrALAZINE  25 mg Oral Q8H  . isosorbide mononitrate  30 mg Oral Daily  . metoprolol succinate  100 mg Oral Daily  . sodium bicarbonate  650 mg Oral TID  . sodium chloride  3 mL Intravenous Q12H  . [START ON 08/03/2013] vancomycin  2,000 mg Intravenous  Q48H      Physical Exam: Vital signs in last 24 hours: Temp:  [98 F (36.7 C)-98.6 F (37 C)] 98 F (36.7 C) (12/30 2058) Pulse Rate:  [69-76] 74 (12/30 2058) Resp:  [18-24] 24 (12/30 [id number]) BP: (162-222)/(56-81) 188/81 mmHg (12/30 2058) SpO2:  [99 %-100 %] 100 % (12/30 2058) Weight:  [321 lb 10.4 oz (145.9 kg)] 321 lb 10.4 oz (145.9 kg) (12/30 0524) Weight change:  Last BM Date: 08/01/13  Intake/Output from previous day: 12/29 0701 - 12/30 0700 In: -  Out: 875 [Urine:850; Stool:25] Total I/O In: 3 [I.V.:3] Out: 500 [Urine:500]   Physical Exam: General- pt is awake,alert,follows commands Resp- No acute REsp distress, decreased bs at bases. CVS- S1S2 regular in rate and rhythm GIT- BS+, soft, NT, ND, obese, Colostomy bag insitu. EXT- 3+ LE Edema,Chronic lymphedema changes + CNS- CN 2-12 grossly intact.  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               2)HTN  BP not at goal Target Organ damage  CKD  Medication-   On Calcium Channel Blockers On Beta blockers On Alpha and beta Blockers  On Vasodilators- On Central Acting Sympatholytics-  3)Anemia HGb at goal (9--11) NO ned of EPO yet.  4)CKD Mineral-Bone Disorder Secondary Hyperparathyroidism present. On calcitriol Phosphorus at goal.   5)Fall- admitted with fall. CPK elevated Primary MD following  6)FEN  Normokalemic NOrmonatremic   7)Acid base Co2 NOT at goal On PO bicarb    Plan:  1) If Bp not better -will sugest to  a) Increase Lasix to 80mg  Iv BID    b) Pt on Both Clonidine and Metoprolol  And HR has dropped -In case BP still not better can change Metoproll to Labetalol .     c) if still not better can change amlodipine to Nifedipine cc 90mg  po Daily.  2)Pt is NOt uremic at this time but pt is close to wheer he will need HD soon. Pt has CKD stage 4/5 and also has AVf placed but pt today says he will not accept to go on HD.  3) Follow vanco levels       Sanya Kobrin S 08/01/2013, 9:18 PM

## 2013-08-01 NOTE — Evaluation (Signed)
Physical Therapy Evaluation Patient Details Name: [name] Snow MRN: [id number] DOB: [birth year] Today's Date: 08/01/2013 Time: [id number] PT Time Calculation (min): 34 min  PT Assessment / Plan / Recommendation History of Present Illness  A morbidly obese male was admitted with rhabdomyolysis after falling at home and lying on the floor for an extended period of time.  He has multiple medical illnesses including stage 4 remnal failure, permanent colostomy and chronic LE lymphedema.  He had not been strong enough to ambulate for the past month and has been functioning out of a w/c, able to transfer independently with a cane.  He had family assist with feeding and bathing.  He is expressively limited but is oriented and appropriate.  Clinical Impression   Pt was seen for evaluation and found to be very deconditioned with generalized weakness.  He is cooperative and follows all directions.  He needed max to mod assist to transfer bed to chair.   Transfer was very labored.  He has, in my  opinion, rehab potential to regain the ability to walk and would benefit from SNF at d/c.    PT Assessment  Patient needs continued PT services    Follow Up Recommendations  SNF    Does the patient have the potential to tolerate intense rehabilitation      Barriers to Discharge Decreased caregiver support      Equipment Recommendations  None recommended by PT    Recommendations for Other Services     Frequency Min 3X/week    Precautions / Restrictions Precautions Precautions: Fall Restrictions Weight Bearing Restrictions: No   Pertinent Vitals/Pain       Mobility  Bed Mobility Bed Mobility: Supine to Sit Supine to Sit: 2: Max assist;With rails Transfers Transfers: Sit to Stand;Stand to Sit;Stand Pivot Transfers Sit to Stand: 3: Mod assist;With upper extremity assist;From bed Stand to Sit: 3: Mod assist;With upper extremity assist;To bed;To chair/3-in-1 Stand Pivot Transfers: 3: Mod  assist Details for Transfer Assistance: pt used cane to assist transfer bed to chair...transfer is extremely labored and he had difficulty completing the transfer before sitting.  He had great difficulty repositioning himself in the chair.  A sling was placed in the chair to assist in returning pt to the bed Ambulation/Gait Ambulation/Gait Assistance: Not tested (comment) (unable)    Exercises General Exercises - Lower Extremity Ankle Circles/Pumps: AROM;Both;10 reps;Supine Short Arc Quad: AROM;Both;10 reps;Supine Heel Slides: AAROM;Both;10 reps;Supine Hip ABduction/ADduction: AAROM;Both;10 reps;Supine   PT Diagnosis: Generalized weakness;Difficulty walking  PT Problem List: Decreased strength;Decreased activity tolerance;Decreased mobility;Cardiopulmonary status limiting activity;Obesity PT Treatment Interventions: Functional mobility training;Therapeutic exercise;Patient/family education     PT Goals(Current goals can be found in the care plan section) Acute Rehab PT Goals Patient Stated Goal: none stated PT Goal Formulation: With patient Time For Goal Achievement: 08/15/13 Potential to Achieve Goals: Fair  Visit Information  Last PT Received On: 08/01/13 History of Present Illness: A morbidly obese male was admitted with rhabdomyolysis after falling at home and lying on the floor for an extended period of time.  He has multiple medical illnesses including stage 4 remnal failure, permanent colostomy and chronic LE lymphedema.  He had not been strong enough to ambulate for the past month and has been functioning out of a w/c, able to transfer independently with a cane.  He had family assist with feeding and bathing.  He is expressively limited but is oriented and appropriate.       Prior Functioning  Home Living Family/patient expects to  be discharged to:: Skilled nursing facility Prior Function Level of Independence: Needs assistance Gait / Transfers Assistance Needed: transfers  independently with a cane, unable to walk ADL's / Homemaking Assistance Needed: assist needed with getting food, bathing Communication Communication: Expressive difficulties    Cognition  Cognition Arousal/Alertness: Awake/alert Behavior During Therapy: WFL for tasks assessed/performed Overall Cognitive Status: Within Functional Limits for tasks assessed    Extremity/Trunk Assessment Lower Extremity Assessment Lower Extremity Assessment: Generalized weakness (bilateral lymphedema on top of obesity)   Balance Balance Balance Assessed: Yes Static Sitting Balance Static Sitting - Balance Support: Left upper extremity supported Static Sitting - Level of Assistance: 6: Modified independent (Device/Increase time)  End of Session PT - End of Session Equipment Utilized During Treatment: Gait belt Activity Tolerance: Patient tolerated treatment well Patient left: in chair;with call bell/phone within reach (RN to place chair alarm) Nurse Communication: Mobility status  GP     Prentiss 08/01/2013, 11:11 AM

## 2013-08-01 NOTE — Care Management Note (Addendum)
    Page 1 of 1   08/02/2013     11:51:53 AM   CARE MANAGEMENT NOTE 08/02/2013  Patient:  [name] Snow, [name] Snow   Account Number:  [id number]  Date Initiated:  08/01/2013  Documentation initiated by:  Theophilus Kinds  Subjective/Objective Assessment:   Pt admitted from home with rhabdomylosis. Pt lives alone and has a sister who is very active in the care of the pt [name] Snow).  Pt has no DME or HH in the home at present.     Action/Plan:   PT recommends SNF at discharge. CSw is aware and will initiate bed search if pt agrees.   Anticipated DC Date:  08/04/2013   Anticipated DC Plan:  SKILLED NURSING FACILITY  In-house referral  Clinical Social Worker      DC Planning Services  CM consult      Choice offered to / List presented to:             Status of service:  Completed, signed off Medicare Important Message given?  NA - LOS <3 / Initial given by admissions (If response is "NO", the following Medicare IM given date fields will be blank) Date Medicare IM given:   Date Additional Medicare IM given:    Discharge Disposition:  [place]  Per UR Regulation:    If discussed at Long Length of Stay Meetings, dates discussed:    Comments:  08/02/13 [place], RN BSN CM Pt is discharged to [facility] in [place]. CSW to arrange discharge to facility.  08/01/13 [place], RN BSN CM

## 2013-08-01 NOTE — Clinical Social Work Placement (Signed)
Clinical Social Work Department CLINICAL SOCIAL WORK PLACEMENT NOTE 08/01/2013  Patient:  [name] Snow, [name] Snow  Account Number:  [id number] Admit date:  07/31/2013  Clinical Social Worker:  Benay Pike, LCSW  Date/time:  08/01/2013 03:30 PM  Clinical Social Work is seeking post-discharge placement for this patient at the following level of care:   [place]   (*CSW will update this form in Epic as items are completed)   08/01/2013  Patient/family provided with [facility] Department of Clinical Social Work's list of facilities offering this level of care within the geographic area requested by the patient (or if unable, by the patient's family).  08/01/2013  Patient/family informed of their freedom to choose among providers that offer the needed level of care, that participate in Medicare, Medicaid or managed care program needed by the patient, have an available bed and are willing to accept the patient.  08/01/2013  Patient/family informed of [facility]' ownership interest in [facility], as well as of the fact that they are under no obligation to receive care at this facility.  PASARR submitted to [employer] on  PASARR number received from [facility] on   FL2 transmitted to all facilities in geographic area requested by pt/family on  08/01/2013 FL2 transmitted to all facilities within larger geographic area on   Patient informed that his/her managed care company has contracts with or will negotiate with  certain facilities, including the following:     Patient/family informed of bed offers received:  08/01/2013 Patient chooses bed at [place] Physician recommends and patient chooses bed at  [place]  Patient to be transferred to  on   Patient to be transferred to facility by   The following physician request were entered in Epic:   Additional Comments: Pt has existing pasarr number.  Benay Pike, [place]

## 2013-08-01 NOTE — Consult Note (Signed)
CARDIOLOGY CONSULT NOTE   Patient ID: [name] DANEHY MRN: [id number] DOB/AGE: June 09, 1941 72 y.o.  Admit Date: 07/31/2013 Referring Physician: PTH Primary Physician: Tula Nakayama, MD Consulting Cardiologist: Rozann Lesches MD Reason for Consultation: Abnormal cardiac enzymes  Clinical Summary [name] is a 72 y.o.male admitted after being found on the floor by his sister, with urine and feces around him. He was apparently on his way to the bathroom. Patient states that "his legs gave out."  He did not pass out or feel dizzy. He did feel his heart rate go up.   He has a history of chronic kidney disease, hypertension, chronic lower extremity lymphedema, and chronic pancreatitis. Other history includes alcohol abuse. The patient has been in worsening health over the last several months. He states that he is been having worsening shortness of breath and LEE although he cannot tell me how long this has been occuring. Notes suggest that he is being planned for SNF.   The patient has a significant speech impediment and is poor historian making history is difficult to obtain. He gives one to three word answers only.   CK elevated initially at 4896, down to 2870. Troponin I level was only minimally increased at 0.34 and subsequently normal. ECG reviewed finding sinus rhythm with nonspecific ST changes, somewhat peaked T waves in the anterolateral leads. He does not endorse any chest pain.   No Known Allergies  Medications Scheduled Medications: . albuterol  2.5 mg Nebulization Q6H  . allopurinol  300 mg Oral Daily  . amLODipine  10 mg Oral Daily  . aspirin  325 mg Oral Daily  . calcitRIOL  0.25 mcg Oral Daily  . cloNIDine  0.3 mg Oral Q8H  . furosemide  40 mg Intravenous BID  . heparin  5,000 Units Subcutaneous Q8H  . hydrALAZINE  25 mg Oral Q8H  . isosorbide mononitrate  30 mg Oral Daily  . metoprolol succinate  100 mg Oral Daily  . sodium bicarbonate  650 mg Oral TID  .  sodium chloride  3 mL Intravenous Q12H  . vancomycin  2,000 mg Intravenous Once  . [START ON 08/03/2013] vancomycin  2,000 mg Intravenous Q48H    PRN Medications: acetaminophen, acetaminophen, albuterol, alum & mag hydroxide-simeth, hydrALAZINE, HYDROcodone-acetaminophen, labetalol, ondansetron (ZOFRAN) IV, ondansetron   Past Medical History  Diagnosis Date  . Chronic pancreatitis   . Hyperlipidemia   . Colostomy in place   . Intestinal obstruction   . Alcohol abuse   . Morbid obesity   . Hypertension   . Dialysis care   . Metabolic acidosis [id number]    Past Surgical History  Procedure Laterality Date  . Colectomy    . Partial toenail removal      Family History  Problem Relation Age of Onset  . Hypertension Sister   . Hypertension Mother   . Hypertension Father     Social History [name] reports that he has never smoked. He does not have any smokeless tobacco history on file. [name] reports that he does not drink alcohol.  Review of Systems Otherwise reviewed and negative except as outlined.  Physical Examination Blood pressure 201/79, pulse 76, temperature 98.3 F (36.8 C), temperature source Oral, resp. rate 18, height 5\' 6"  (1.676 m), weight 321 lb 10.4 oz (145.9 kg), SpO2 100.00%.  Intake/Output Summary (Last 24 hours) at 08/01/13 1113 Last data filed at 08/01/13 0527  Gross per 24 hour  Intake      0 ml  Output    875 ml  Net   -875 ml    Telemetry: Sinus rhythm.  Up in chair, no specific complaints, appears comfortable. HEENT: Conjunctiva and lids normal, oropharynx clear. Neck: Supple, increased girth without obvious elevated JVP, no thyromegaly. Lungs: Clear to auscultation decreased breath sounds, nonlabored breathing at rest. Cardiac: Regular rate and rhythm, no S3 or significant systolic murmur, no pericardial rub. Abdomen: Soft, nontender, bowel sounds present. Colostomy. Extremities: Mild edema, distal pulses 1-2+. Skin: Warm and  dry. Musculoskeletal: No kyphosis. Neuropsychiatric: Alert and oriented x3, calm, limited speech.  Prior Cardiac Testing/Procedures  Lab Results  Basic Metabolic Panel:  Recent Labs Lab 07/31/13 1707 08/01/13 0240  NA 139 139  K 4.9 5.0  CL 105 107  CO2 18* 18*  GLUCOSE 107* 104*  BUN 38* 38*  CREATININE 4.02* 4.03*  CALCIUM 9.7 9.1    Liver Function Tests:  Recent Labs Lab 07/31/13 1707 08/01/13 0240  AST 112* 86*  ALT 23 22  ALKPHOS 52 43  BILITOT 1.3* 1.0  PROT 7.2 6.3  ALBUMIN 3.7 3.1*    CBC:  Recent Labs Lab 07/31/13 1707 08/01/13 0240  WBC 11.2* 8.6  NEUTROABS 8.8*  --   HGB 10.5* 9.6*  HCT 32.7* 29.9*  MCV 79.6 79.3  PLT 255 218    Cardiac Enzymes:  Recent Labs Lab 07/31/13 1707 08/01/13 0240 08/01/13 0909  CKTOTAL 4896* 2870*  --   TROPONINI  --  0.34* <0.30    Radiology: Dg Chest 1 View  07/31/2013   CLINICAL DATA:  Morbid obesity, weakness, hypertension  EXAM: CHEST - 1 VIEW  COMPARISON:  August 09, 2011  FINDINGS: The heart size and mediastinal contours are stable. The heart size is enlarged. There is no focal infiltrate, pulmonary edema, or pleural effusion. The visualized skeletal structures are stable.  IMPRESSION: No active cardiopulmonary disease.   Electronically Signed   By: Abelardo Diesel M.D.   On: 07/31/2013 17:22   Ct Head Wo Contrast  07/31/2013   CLINICAL DATA:  Fall last night.  Mental status changes.  Weakness.  EXAM: CT HEAD WITHOUT CONTRAST  CT CERVICAL SPINE WITHOUT CONTRAST   IMPRESSION: 1. Motion and patient body habitus degraded exams. 2. Given this factor, no convincing evidence of acute fracture or subluxation throughout the cervical spine. No convincing evidence of acute intracranial abnormality. 3. Advanced spondylosis, with areas of bilateral neural foraminal narrowing. 4. Straightening of expected cervical lordosis could be positional, due to muscular spasm, or ligamentous injury.   Electronically Signed   By:  Abigail Miyamoto M.D.   On: 07/31/2013 17:54   Ct Cervical Spine Wo Contrast  07/31/2013   CLINICAL DATA:  Fall last night.  Mental status changes.  Weakness.  EXAM: CT HEAD WITHOUT CONTRAST  CT CERVICAL SPINE WITHOUT CONTRAST   IMPRESSION: 1. Motion and patient body habitus degraded exams. 2. Given this factor, no convincing evidence of acute fracture or subluxation throughout the cervical spine. No convincing evidence of acute intracranial abnormality. 3. Advanced spondylosis, with areas of bilateral neural foraminal narrowing. 4. Straightening of expected cervical lordosis could be positional, due to muscular spasm, or ligamentous injury.   Electronically Signed   By: Abigail Miyamoto M.D.   On: 07/31/2013 17:54    Impression  1. Elevated total CK most likely reflective of musculoskeletal source, mild rhabdomyolysis after patient was on floor after presumed fall. He does not endorse any chest pain and his troponin I level is only minimally  elevated (subsequent level completely normal). ECG is nonspecific overall.  2. Hypertension, blood pressure has been increased recently. Currently on Norvasc, Catapres, hydralazine, Imdur, and Toprol-XL.  3. CKD, stage 4-5, creatinine 4.0. Patient has declined hemodialysis based on record review.  4. Declining functional status and general health, SNF has been considered recently.  5. Morbid obesity.   Recommendations  At this point doubt ACS, no clear evidence of arrhythmia by telemetry monitoring so far. Echocardiogram will be obtained for baseline structural cardiac assessment. Otherwise continue medical therapy for hypertension and other comorbidities.  Satira Sark, M.D., F.A.C.C.

## 2013-08-01 NOTE — Progress Notes (Signed)
[facility] PROGRESS NOTE  [name] Snow [id number] DOB: Apr 04, 1941 DOA: 07/31/2013 PCP: Tula Nakayama, MD   Code Status: DO NOT RESUSCITATE Family Communication: Not available Disposition Plan: Likely discharge to skilled nursing facility when clinically appropriate.   Consultants:  Cardiology and nephrology pending  Procedures:  Echocardiogram pending  Antibiotics:  Vancomycin pending  HPI/Subjective: The patient was noted to have chest pain overnight. Currently, he complains of no chest pain, shortness of breath, or leg pain.   Objective: Filed Vitals:   08/01/13 0524  BP: 201/79  Pulse: 76  Temp: 98.3 F (36.8 C)  Resp: 18    Intake/Output Summary (Last 24 hours) at 08/01/13 0942 Last data filed at 08/01/13 [id number]  Gross per 24 hour  Intake      0 ml  Output    875 ml  Net   -875 ml   Filed Weights   07/31/13 1524 07/31/13 2054 08/01/13 0524  Weight: 163.295 kg (360 lb) 147.7 kg (325 lb 9.9 oz) 145.9 kg (321 lb 10.4 oz)    Exam:   General:  Morbidly obese debilitated-appearing 72 year old African-American man sitting up in bed, in no acute distress.  Cardiovascular: S1, S2, with a harsh 3/6 systolic murmur.  Respiratory: Few scattered expiratory wheezes, no crackles. Breathing is nonlabored.  Abdomen: Right sided colostomy noted. Bag is filled with air and brown loose stool. Positive bowel sounds, obese, nontender, nondistended.  Musculoskeletal/extremities: Thrill/bruit palpated/auscultated left upper extremity from previously placed fistula. 3-4+ bilateral lower extremity edema, nonpitting with significant bilateral chronic venous stasis changes and elephantiasis. Mild erythema over the right leg, query cellulitis.  Neurologic: He is alert and oriented to himself and hospital. He is slow to speak, but no obvious  aphasia or dysarthria. He is unable to lift his legs against gravity bilaterally. He has a moderate strength bilateral hand  grip.  Data Reviewed: Basic Metabolic Panel:  Recent Labs Lab 07/31/13 1707 08/01/13 0240  NA 139 139  K 4.9 5.0  CL 105 107  CO2 18* 18*  GLUCOSE 107* 104*  BUN 38* 38*  CREATININE 4.02* 4.03*  CALCIUM 9.7 9.1   Liver Function Tests:  Recent Labs Lab 07/31/13 1707 08/01/13 0240  AST 112* 86*  ALT 23 22  ALKPHOS 52 43  BILITOT 1.3* 1.0  PROT 7.2 6.3  ALBUMIN 3.7 3.1*   No results found for this basename: LIPASE, AMYLASE,  in the last 168 hours No results found for this basename: AMMONIA,  in the last 168 hours CBC:  Recent Labs Lab 07/31/13 1707 08/01/13 0240  WBC 11.2* 8.6  NEUTROABS 8.8*  --   HGB 10.5* 9.6*  HCT 32.7* 29.9*  MCV 79.6 79.3  PLT 255 218   Cardiac Enzymes:  Recent Labs Lab 07/31/13 1707 08/01/13 0240  CKTOTAL 4896* 2870*  TROPONINI  --  0.34*   BNP (last 3 results) No results found for this basename: PROBNP,  in the last 8760 hours CBG:  Recent Labs Lab 08/01/13 0747  GLUCAP 88    No results found for this or any previous visit (from the past 240 hour(s)).   Studies: Dg Chest 1 View  07/31/2013   CLINICAL DATA:  Morbid obesity, weakness, hypertension  EXAM: CHEST - 1 VIEW  COMPARISON:  August 09, 2011  FINDINGS: The heart size and mediastinal contours are stable. The heart size is enlarged. There is no focal infiltrate, pulmonary edema, or pleural effusion. The visualized skeletal structures are stable.  IMPRESSION: No active cardiopulmonary  disease.   Electronically Signed   By: Abelardo Diesel M.D.   On: 07/31/2013 17:22   Ct Head Wo Contrast  07/31/2013   CLINICAL DATA:  Fall last night.  Mental status changes.  Weakness.  EXAM: CT HEAD WITHOUT CONTRAST  CT CERVICAL SPINE WITHOUT CONTRAST  TECHNIQUE: Multidetector CT imaging of the head and cervical spine was performed following the standard protocol without intravenous contrast. Multiplanar CT image reconstructions of the cervical spine were also generated.  COMPARISON:   None  FINDINGS: CT HEAD FINDINGS  Sinuses/Soft tissues: Motion degradation, despite 2 attempts. No gross soft tissue swelling. Mild hyperostosis frontalis interna, without skull fracture. Clear paranasal sinuses and mastoid air cells.  Intracranial: Given motion degradation, no mass lesion, hemorrhage, hydrocephalus, acute infarct, intra-axial, or extra-axial fluid collection.  CT CERVICAL SPINE FINDINGS  Spinal visualization through the bottom of T2. Mild to moderate motion and patient body habitus degradation. Prevertebral soft tissues grossly within normal limits. Dense carotid atherosclerosis bilaterally. No apical pneumothorax. Multilevel spondylosis. This results an neural foraminal narrowing, greater on the left than right. Examples at C4-5 and C5-6.  Skull base grossly intact. Apparent rotation of C1 relative to C2 is favored to be positional. Vertebral body height grossly maintained. Straightening and mild reversal of expected cervical lordosis. Facets are grossly aligned. Coronal reformats demonstrate a normal C1-C2 articulation.  IMPRESSION: 1. Motion and patient body habitus degraded exams. 2. Given this factor, no convincing evidence of acute fracture or subluxation throughout the cervical spine. No convincing evidence of acute intracranial abnormality. 3. Advanced spondylosis, with areas of bilateral neural foraminal narrowing. 4. Straightening of expected cervical lordosis could be positional, due to muscular spasm, or ligamentous injury.   Electronically Signed   By: Abigail Miyamoto M.D.   On: 07/31/2013 17:54   Ct Cervical Spine Wo Contrast  07/31/2013   CLINICAL DATA:  Fall last night.  Mental status changes.  Weakness.  EXAM: CT HEAD WITHOUT CONTRAST  CT CERVICAL SPINE WITHOUT CONTRAST  TECHNIQUE: Multidetector CT imaging of the head and cervical spine was performed following the standard protocol without intravenous contrast. Multiplanar CT image reconstructions of the cervical spine were also  generated.  COMPARISON:  None  FINDINGS: CT HEAD FINDINGS  Sinuses/Soft tissues: Motion degradation, despite 2 attempts. No gross soft tissue swelling. Mild hyperostosis frontalis interna, without skull fracture. Clear paranasal sinuses and mastoid air cells.  Intracranial: Given motion degradation, no mass lesion, hemorrhage, hydrocephalus, acute infarct, intra-axial, or extra-axial fluid collection.  CT CERVICAL SPINE FINDINGS  Spinal visualization through the bottom of T2. Mild to moderate motion and patient body habitus degradation. Prevertebral soft tissues grossly within normal limits. Dense carotid atherosclerosis bilaterally. No apical pneumothorax. Multilevel spondylosis. This results an neural foraminal narrowing, greater on the left than right. Examples at C4-5 and C5-6.  Skull base grossly intact. Apparent rotation of C1 relative to C2 is favored to be positional. Vertebral body height grossly maintained. Straightening and mild reversal of expected cervical lordosis. Facets are grossly aligned. Coronal reformats demonstrate a normal C1-C2 articulation.  IMPRESSION: 1. Motion and patient body habitus degraded exams. 2. Given this factor, no convincing evidence of acute fracture or subluxation throughout the cervical spine. No convincing evidence of acute intracranial abnormality. 3. Advanced spondylosis, with areas of bilateral neural foraminal narrowing. 4. Straightening of expected cervical lordosis could be positional, due to muscular spasm, or ligamentous injury.   Electronically Signed   By: Abigail Miyamoto M.D.   On: 07/31/2013 17:54  Scheduled Meds: . allopurinol  300 mg Oral Daily  . amLODipine  10 mg Oral Daily  . aspirin  325 mg Oral Daily  . calcitRIOL  0.25 mcg Oral Daily  . cloNIDine  0.3 mg Oral Q8H  . heparin  5,000 Units Subcutaneous Q8H  . metoprolol succinate  100 mg Oral Daily  . sodium bicarbonate  650 mg Oral TID  . sodium chloride  3 mL Intravenous Q12H   Continuous  Infusions: . sodium chloride      Assessment:  Principal Problem:   Rhabdomyolysis Active Problems:   Elephantiasis   Fall at home   Bilateral lower extremity edema   HTN (hypertension), malignant   Chest pain   OBESITY, MORBID   HYPERURICEMIA   Metabolic acidosis   Chronic kidney disease, stage IV (severe)   History of colostomy   Cellulitis of leg, right  1. Status post fall at home resulting in mild rhabdomyolysis. The patient denies losing consciousness, but states that his legs just became weak and they gave away. CT of his head reveals no acute intracranial findings. His total CK has decreased since admission with holding diuretics and giving him gentle hydration.  Chest pain and associated mild elevation in troponin I. It would not be surprising if the patient has underlying coronary artery disease, given his other comorbid conditions. He has no chest pain currently. The elevated troponin I could also be secondary to residual rhabdomyolysis and chronic kidney disease. I doubt that the patient is a candidate for invasive diagnostic workup, but will ask cardiology to opine regarding chronic, long-term management of heart failure and malignant hypertension.  Malignant hypertension. The patient's systolic blood pressure still greater than 200 which may be secondary to IV fluids in part. The dose of Toprol has been increased from 50 to100 mg daily. The dose of clonidine has been increased from 0.3 mg twice a day to 3 times a day. Amlodipine was added at 10 mg daily. Valsartan is on hold because of worsening renal failure. Torsemide is on hold while he is being hydrated, but diuretic therapy will be restarted and IV fluids will be held..  Stage IV chronic kidney disease. In chart review, his creatinine ranges from 4.0-4.3. Currently his creatinine is at baseline. He is nonoliguric. The patient has a left upper extremity fistula, but he refuses dialysis even if it's recommended. He is  followed by Dr. Lowanda Foster as an outpatient.  Chronic metabolic acidosis. This is likely secondary to chronic kidney disease. We'll continue oral bicarbonate tablets.  Bilateral lower extremity edema/Gen. anasarca. The patient's ejection fraction is unknown. He appears to be volume overloaded which could be secondary to undiagnosed heart failure and/or worsening renal function.  Anemia. His anemia is likely multifactorial including chronic kidney disease.  Query right lower extremity cellulitis.      Plan: 1. Consult nephrology and cardiology for their recommendations regarding current and long-term management. 2. Will discontinue IV fluids and start IV Lasix. We'll monitor his renal function closely. 3. We'll add Imdur and oral hydralazine. 4. We'll add vancomycin for treatment of right lower extremity cellulitis. 5. For further evaluation, continue to cycle cardiac enzymes, order 2-D echocardiogram, anemia panel, and TSH/free T4.   Time spent: 45 minutes.    [facility]  [facility] Pager [phone]  Please contact night-coverage at [email or web address], password [email or web address] 08/01/2013, 9:42 AM  LOS: 1 day

## 2013-08-01 NOTE — Clinical Social Work Psychosocial (Signed)
Clinical Social Work Department BRIEF PSYCHOSOCIAL ASSESSMENT 08/01/2013  Patient:  [name] Snow, [name] Snow     Account Number:  [id number]     Admit date:  07/31/2013  Clinical Social Worker:  Wyatt Haste  Date/Time:  08/01/2013 03:30 PM  Referred by:  Physician  Date Referred:  08/01/2013 Referred for  SNF Placement   Other Referral:   Interview type:  Patient Other interview type:   and niece- Santiago Glad    PSYCHOSOCIAL DATA Living Status:  ALONE Admitted from facility:   Level of care:   Primary support name:  [name] Primary support relationship to patient:  SIBLING Degree of support available:   supportive    CURRENT CONCERNS Current Concerns  Post-Acute Placement   Other Concerns:    SOCIAL WORK ASSESSMENT / PLAN CSW met with pt at bedside. Pt alert and oriented and reports he lives alone. His sister, [name] is his best support. CSW spoke with Annie's daughter, Santiago Glad with pt's permission. Santiago Glad works at [employer] Pt indicates he has done okay at home. However, Santiago Glad reports it has been difficult. [name] has been providing all meals and family is assisting pt a lot. [name] also is doing pt's medications weekly, but they feel that pt is still not taking them appropriately. Pt has a colostomy which he reports he takes care of himself, but Santiago Glad said they also assist with this. PT evaluated pt today and recommendation is for SNF. Pt has been to [facility] in past and this is pt/family request. CSW initiated bed search and [facility] offered bed which is accepted. Pt and family aware of copays for SNF. Pt has a fistula as it was thought that he would need to start dialysis in the past, but it has not been required yet.   Assessment/plan status:  Psychosocial Support/Ongoing Assessment of Needs Other assessment/ plan:   Information/referral to community resources:   SNF list    PATIENT'S/FAMILY'S RESPONSE TO PLAN OF CARE: Pt and family agreeable to SNF. Family had started SNF  search from home and had already discussed with [facility]. CSW will continue to follow and assist with d/c.       Benay Pike, [place]

## 2013-08-02 ENCOUNTER — Inpatient Hospital Stay (HOSPITAL_COMMUNITY): Payer: Medicare Other

## 2013-08-02 DIAGNOSIS — I517 Cardiomegaly: Secondary | ICD-10-CM

## 2013-08-02 DIAGNOSIS — I5030 Unspecified diastolic (congestive) heart failure: Secondary | ICD-10-CM | POA: Diagnosis present

## 2013-08-02 DIAGNOSIS — N184 Chronic kidney disease, stage 4 (severe): Secondary | ICD-10-CM

## 2013-08-02 LAB — CBC
MCH: 26.6 pg (ref 26.0–34.0)
Platelets: 229 10*3/uL (ref 150–400)
RBC: 3.64 MIL/uL — ABNORMAL LOW (ref 4.22–5.81)
WBC: 6.9 10*3/uL (ref 4.0–10.5)

## 2013-08-02 LAB — RENAL FUNCTION PANEL
Albumin: 2.8 g/dL — ABNORMAL LOW (ref 3.5–5.2)
Calcium: 8.8 mg/dL (ref 8.4–10.5)
Chloride: 105 mEq/L (ref 96–112)
GFR calc Af Amer: 15 mL/min — ABNORMAL LOW (ref >90)
GFR calc non Af Amer: 13 mL/min — ABNORMAL LOW (ref >90)
Potassium: 4.4 mEq/L (ref 3.7–5.3)
Sodium: 138 mEq/L (ref 137–147)

## 2013-08-02 LAB — HEPATIC FUNCTION PANEL
AST: 52 U/L — ABNORMAL HIGH (ref 0–37)
Alkaline Phosphatase: 40 U/L (ref 39–117)
Total Protein: 6 g/dL (ref 6.0–8.3)

## 2013-08-02 LAB — GLUCOSE, CAPILLARY: Glucose-Capillary: 99 mg/dL (ref 70–99)

## 2013-08-02 LAB — LIPASE, BLOOD: Lipase: 46 U/L (ref 11–59)

## 2013-08-02 LAB — CK: Total CK: 1394 U/L — ABNORMAL HIGH (ref 7–232)

## 2013-08-02 MED ORDER — NIFEDIPINE ER OSMOTIC RELEASE 30 MG PO TB24
90.0000 mg | ORAL_TABLET | Freq: Every day | ORAL | Status: DC
  Administered 2013-08-02: 90 mg via ORAL
  Filled 2013-08-02: qty 3

## 2013-08-02 MED ORDER — ISOSORBIDE MONONITRATE ER 30 MG PO TB24: 30.0000 mg | ORAL_TABLET | Freq: Every day | ORAL | Status: AC

## 2013-08-02 MED ORDER — DOXYCYCLINE HYCLATE 100 MG PO TABS: 100.0000 mg | ORAL_TABLET | Freq: Two times a day (BID) | ORAL | Status: DC

## 2013-08-02 MED ORDER — CLONIDINE HCL 0.3 MG PO TABS: 0.3000 mg | ORAL_TABLET | Freq: Three times a day (TID) | ORAL | Status: DC

## 2013-08-02 MED ORDER — ALBUTEROL SULFATE (2.5 MG/3ML) 0.083% IN NEBU: 2.5000 mg | INHALATION_SOLUTION | Freq: Four times a day (QID) | RESPIRATORY_TRACT | Status: DC | PRN

## 2013-08-02 MED ORDER — NIFEDIPINE ER OSMOTIC RELEASE 90 MG PO TB24: 90.0000 mg | ORAL_TABLET | Freq: Every day | ORAL | Status: AC

## 2013-08-02 MED ORDER — ALBUTEROL SULFATE (2.5 MG/3ML) 0.083% IN NEBU
2.5000 mg | INHALATION_SOLUTION | Freq: Three times a day (TID) | RESPIRATORY_TRACT | Status: DC
  Administered 2013-08-02: 2.5 mg via RESPIRATORY_TRACT
  Filled 2013-08-02: qty 3

## 2013-08-02 MED ORDER — METOPROLOL SUCCINATE ER 100 MG PO TB24: 100.0000 mg | ORAL_TABLET | Freq: Every day | ORAL | Status: DC

## 2013-08-02 MED ORDER — HYDRALAZINE HCL 25 MG PO TABS: 25.0000 mg | ORAL_TABLET | Freq: Three times a day (TID) | ORAL | Status: AC

## 2013-08-02 MED ORDER — FUROSEMIDE 80 MG PO TABS: 80.0000 mg | ORAL_TABLET | Freq: Two times a day (BID) | ORAL | Status: DC

## 2013-08-02 NOTE — Progress Notes (Signed)
     Consulting cardiologist: Dr. Satira Sark  Echocardiogram reviewed - see full report. Vigorous LVEF without wall motion abnormality. No further cardiac workup planned at this time. Will sign off.  Satira Sark, M.D., F.A.C.C.

## 2013-08-02 NOTE — Clinical Social Work Note (Signed)
Pt d/c today to [facility]. Pt, pt's niece Santiago Glad, and facility aware and agreeable. D/C summary faxed. Out of facility DNR in packet.   Benay Pike, [place]

## 2013-08-02 NOTE — Progress Notes (Signed)
*  PRELIMINARY RESULTS* Echocardiogram 2D Echocardiogram has been performed.  [name] Snow 08/02/2013, 9:30 AM

## 2013-08-02 NOTE — Progress Notes (Signed)
[name] Snow  MRN: [id number]  DOB/AGE: March 16, 1941 72 y.o.  Primary [place], [place]  Admit date: 07/31/2013  Chief Complaint:  Chief Complaint  Patient presents with  . Leg Swelling    S-Pt presented on  07/31/2013 with  Chief Complaint  Patient presents with  . Leg Swelling  .    Pt offers no new complaints.     Meds  . albuterol  2.5 mg Nebulization TID  . allopurinol  300 mg Oral Daily  . aspirin  325 mg Oral Daily  . calcitRIOL  0.25 mcg Oral Daily  . cloNIDine  0.3 mg Oral Q8H  . furosemide  40 mg Intravenous BID  . heparin  5,000 Units Subcutaneous Q8H  . hydrALAZINE  25 mg Oral Q8H  . isosorbide mononitrate  30 mg Oral Daily  . metoprolol succinate  100 mg Oral Daily  . NIFEdipine  90 mg Oral Daily  . sodium bicarbonate  650 mg Oral TID  . sodium chloride  3 mL Intravenous Q12H  . [START ON 08/03/2013] vancomycin  2,000 mg Intravenous Q48H      Physical Exam: Vital signs in last 24 hours: Temp:  [98 F (36.7 C)-98.3 F (36.8 C)] 98.3 F (36.8 C) (12/31 [id number]) Pulse Rate:  [70-74] 70 (12/31 0632) Resp:  [20-24] 22 (12/31 0632) BP: (162-188)/(56-81) 177/73 mmHg (12/31 0632) SpO2:  [97 %-100 %] 97 % (12/31 0718) Weight:  [331 lb 5.6 oz (150.3 kg)] 331 lb 5.6 oz (150.3 kg) (12/31 [id number]) Weight change: -28 lb 10.4 oz (-12.995 kg) Last BM Date: 08/02/13  Intake/Output from previous day: 12/30 0701 - 12/31 0700 In: 1118 [P.O.:540; I.V.:3; IV Piggyback:575] Out: 1950 [Urine:1950]     Physical Exam: General- pt is awake,alert,follows commands  Resp- No acute REsp distress, decreased bs at bases.  CVS- S1S2 regular in rate and rhythm  GIT- BS+, soft, NT, ND, obese, Colostomy bag insitu.  EXT- 3+ LE Edema,Chronic lymphedema changes + Access- AVF+ Bruit and thrill  Lab Results: CBC  Recent Labs  08/01/13 0240 08/02/13 0452  WBC 8.6 6.9  HGB 9.6* 9.7*  HCT 29.9* 29.1*  PLT 218 229    BMET  Recent Labs  08/01/13 0240  08/02/13 0452  NA 139 138  K 5.0 4.4  CL 107 105  CO2 18* 20  GLUCOSE 104* 106*  BUN 38* 40*  CREATININE 4.03* 4.30*  CALCIUM 9.1 8.8   Creat trend  2014 3.7--4.5  2013 3.0--4.5  2012 3.0--3.6  2008 1.6--2.5    MICRO Recent Results (from the past 240 hour(s))  URINE CULTURE     Status: None   Collection Time    07/31/13  4:33 PM      Result Value Range Status   Specimen Description URINE, CATHETERIZED   Final   Special Requests NONE   Final   Culture  Setup Time     Final   Value: 08/01/2013 01:12     Performed at [facility]     Final   Value: NO GROWTH     Performed at [facility]   Culture     Final   Value: NO GROWTH     Performed at [facility]   Report Status 08/01/2013 FINAL   Final      Lab Results  Component Value Date   PTH 501.6* 10/10/2012   CALCIUM 8.8 08/02/2013   PHOS 3.4 08/02/2013  Impression: 1)Renal CKD stage 4/5 .  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   2)HTN BP not at goal but much better Target Organ damage  CKD  Medication-  On Calcium Channel Blockers  On Beta blockers  On Alpha and beta Blockers  On Vasodilators-  On Central Acting Sympatholytics-   3)Anemia HGb at goal (9--11)  NO ned of EPO yet.   4)CKD Mineral-Bone Disorder  Secondary Hyperparathyroidism present.  On calcitriol  Phosphorus at goal.   5)Fall- admitted with fall.  CPK elevated  Primary MD following   6)FEN  Normokalemic  NOrmonatremic   7)Acid base  Co2 NOT at goal but better On PO bicarb    Plan:  Will continue current care.      Antonios Ostrow S 08/02/2013, 10:02 AM

## 2013-08-02 NOTE — Progress Notes (Signed)
Discharged to [facility] in stable condition, out via stretcher by [facility], reported to R.Lucia Bitter, LPN at [facility].

## 2013-08-02 NOTE — Discharge Summary (Signed)
Physician Discharge Summary  [name] Snow [id number] DOB: 1940-12-03 DOA: 07/31/2013  PCP: Tula Nakayama, MD  Admit date: 07/31/2013 Discharge date: 08/02/2013  Time spent: 40  minutes  Recommendations for Outpatient Follow-up:  1. D/C to SNF with outpt PCP and  renal follow up 2. pleae follow renal function in 2-3 days 3.  please recheck CPK in 2-3 days   Discharge Diagnoses:  Principal Problem:   Rhabdomyolysis  Active Problems:   Chronic kidney disease, stage IV (severe)   HTN (hypertension), malignant   OBESITY, MORBID   HYPERURICEMIA   Metabolic acidosis   Elephantiasis   Fall at home   Chest pain   Bilateral lower extremity edema   History of colostomy   Cellulitis of leg, right   Anemia in chronic kidney disease   Discharge Condition: fair  Diet recommendation: renal diet  Filed Weights   07/31/13 2054 08/01/13 0524 08/02/13 [id number]  Weight: 147.7 kg (325 lb 9.9 oz) 145.9 kg (321 lb 10.4 oz) 150.3 kg (331 lb 5.6 oz)    History of present illness:  Please refer to admission h&P for details, but in brief, [name] Snow is a 72 y.o. 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 Patient has been in poor health over the past few years, approximately one month ago patient had significant worsening of his ambulation, he was told by his PCP that he needs to go to a skilled nursing facility. His sister and his niece were working on getting him to a skilled nursing facility, in the meantime he  was still living alone, with significant supervision from the patient's sister, who  helped him with feeding and cleaning. patient was last seen by his sister 2 days prior to admission. On the day of admission,  he was found by his sister on the floor with feces and urine around him.  Patient is a very poor historian, has a chronic speech impairment and only is able to speak in 2-3 word sentences. apparently sometime the previous  afternoon while attempting to go to the bathroom, patient fell and could not subsequently get up. Upon repeatedly asking the patient whether or not he had a syncopal episode, patient claims he did not pass out. He was down on the floor the whole day yesterday, last night and was subsequently found by the patient's sister sometime this morning. Patient was then brought to the ED, where a CT scan of the head and a CT scan of the C-spine was negative for acute abnormalities. He was found to be in rhabdomyolysis. Patient admitted to hospitalist service.   Hospital Course:  Acute rhabdomyolysis  Status post fall at home resulting in mild rhabdomyolysis. The patient denies losing consciousness, but states that his legs just became weak and they gave away. CT of his head reveals no acute intracranial findings. His total CK has been decreasing since admission and is 1300 today. Follow up as outpatient.  Chest pain and associated mild elevation in troponin . Currently asymptomatic.The elevated troponin I could also be secondary to residual rhabdomyolysis and chronic kidney disease. Subsequent troponin was normal.  Seen by cardiology and recommended following with echo results  which is pending .  Malignant hypertension.  The patient's systolic blood pressure remained  greater than 200 . The dose of Toprol has been increased from 50  to100 mg daily. The dose of clonidine has been increased from 0.3 mg twice a day to 3 times a day. Amlodipine was added at 10 mg daily. Valsartan discontinued because of worsening renal failure.  IV lasix was started given significant leg edema and advanced CKD. Added imdur and hydralazine. BP is now much improved. Amlodipine switched to nifedipine 90 mg daily.  Stage IV chronic kidney disease.  In chart review, his creatinine ranges  from 4.0-4.3. Currently his creatinine is at baseline. He is nonoliguric. The patient has a left upper extremity fistula, but he refuses dialysis even if it's recommended. He is followed by Dr. Lowanda Foster as an outpatient.  Appreciate renal recommendations while in hospital.  Chronic metabolic acidosis.  This is likely secondary to chronic kidney disease. received oral bicarbonate tablets. Improved now.  Bilateral lower extremity edema/Generalied anasarca.   He appears to be volume overloaded which could be secondary to diastolic  heart failure seen on echo and/or worsening renal function.  2d echo showed EF of 65-70% with grade 1 Diastolic dysfunction. Continue lasix upon discharge.  Anemia.  His anemia is likely multifactorial including chronic kidney disease.   ?right lower extremity cellulitis. Started on IV vancomycin empirically. Will discharge him on oral doxycycline 100 mg bid to complete a 10 day course.  abdominal pain on 12/31 physical exam is benign. Has good colostomy output. LFTs and lipase normal. abdominal xray to r/o any ileus was negative.  Patient seen by PT and given his deconditioning and weakness recommended SNF which has been arranged.   Procedures:  none  Consultations:  Renal   cardiology  Discharge Exam: Filed Vitals:   08/02/13 0632  BP: 177/73  Pulse: 70  Temp: 98.3 F (36.8 C)  Resp: 22    General: Morbidly obese debilitated-appearing  African-American male in no acute distress.  HEENT: no palor, moist oral mucosa Cardiovascular: S1, S2, 3/6 systolic murmur.  Respiratory: clear breath sounds b/l, no added sounds Abdomen: Right sided colostomy noted. Bag is filled with air and brown loose stool. Positive bowel sounds, obese, nontender, nondistended.  Musculoskeletal/extremities:lt upper extremity AV fistula with good thrill.  3-4+ bilateral lower extremity edema, nonpitting with significant bilateral chronic venous stasis changes and  elephantiasis. Mild erythema over the right leg, ?cellulitis. Foley in place Neurologic: AAOX3  Discharge Instructions   Future Appointments Provider Department Dept Phone   08/09/2013 10:00 AM Ap-Do[facility]p Injection Room [facility] [phone]   08/09/2013 10:15 AM Ap-Doibp Injection Room [facility] [phone]       Medication List    STOP taking these medications       torsemide 20 MG tablet  Commonly known as:  DEMADEX      TAKE these medications       albuterol (2.5 MG/3ML) 0.083% nebulizer solution  Commonly known as:  PROVENTIL  Take 3 mLs (2.5 mg total) by nebulization every 6 (six) hours as needed for wheezing or shortness of breath.     allopurinol 300 MG tablet  Commonly known as:  ZYLOPRIM  Take 300 mg by mouth daily.     calcitRIOL 0.25 MCG capsule  Commonly known as:  ROCALTROL  Take 0.25 mcg by mouth daily.     cloNIDine 0.3 MG tablet  Commonly known as:  CATAPRES  Take 0.3 mg by mouth 3 (three) times daily.     doxycycline 100 MG tablet  Commonly known as:  VIBRA-TABS  Take 1 tablet (100 mg total) by mouth 2 (  two) times daily.     furosemide 80 MG tablet  Commonly known as:  LASIX  Take 1 tablet (80 mg total) by mouth 2 (two) times daily.     hydrALAZINE 25 MG tablet  Commonly known as:  APRESOLINE  Take 1 tablet (25 mg total) by mouth every 8 (eight) hours.     isosorbide mononitrate 30 MG 24 hr tablet  Commonly known as:  IMDUR  Take 1 tablet (30 mg total) by mouth daily.     metoprolol succinate 100 MG 24 hr tablet  Commonly known as:  TOPROL-XL  Take 1 tablet (100 mg total) by mouth daily. Take with or immediately following a meal.     NIFEdipine 90 MG 24 hr tablet  Commonly known as:  PROCARDIA XL/ADALAT-CC  Take 1 tablet (90 mg total) by mouth daily.     sodium bicarbonate 650 MG tablet  Take 650 mg by mouth 3 (three) times daily.     triamcinolone cream 0.1 %  Commonly known as:  KENALOG   Apply 1 application topically daily as needed (irritation).     valsartan 320 MG tablet  Commonly known as:  DIOVAN  Take 320 mg by mouth daily.       No Known Allergies     Follow-up Information   Follow up with Tula Nakayama, MD In 1 week. (after discharge from rehab)    Specialty:  Family Medicine   Contact information:   [address] [place] [address] [phone]       Follow up with [facility] S, MD. Schedule an appointment as soon as possible for a visit in 2 weeks.   Specialty:  Nephrology   Contact information:   [address] [place] [address] [phone]        The results of significant diagnostics from this hospitalization (including imaging, microbiology, ancillary and laboratory) are listed below for reference.    Significant Diagnostic Studies: Ct Abdomen Pelvis Wo Contrast  07/04/2013   CLINICAL DATA:  Abdomen pain around colostomy.  EXAM: CT ABDOMEN AND PELVIS WITHOUT CONTRAST  TECHNIQUE: Multidetector CT imaging of the abdomen and pelvis was performed following the standard protocol without intravenous contrast.  COMPARISON:  August 05, 2011  FINDINGS: The liver, spleen, pancreas are normal. The patient is status post prior cholecystectomy. The bilateral adrenal glands are normal. There is small nonobstructing stones within the right kidney. There is no hydronephrosis bilaterally. Mild chronic bilateral perinephric stranding are noted. There is atherosclerosis of the abdominal aorta without aneurysmal dilatation. There is no small bowel obstruction. Right lower quadrant ostomy is identified. Postsurgical changes are identified within the pelvis. The bladder is decompressed with Foley catheter in place. Bilateral inguinal herniation of mesenteric fat are noted. The there is mild dependent atelectasis of the posterior bilateral lung bases. There is minimal right pleural effusion. Degenerative joint changes of the spine are  noted.  IMPRESSION: Right lower quadrant ostomy identified. There is no evidence of bowel obstruction. No acute abnormality is identified in the abdomen and pelvis.   Electronically Signed   By: Abelardo Diesel M.D.   On: 07/04/2013 20:07   Dg Chest 1 View  07/31/2013   CLINICAL DATA:  Morbid obesity, weakness, hypertension  EXAM: CHEST - 1 VIEW  COMPARISON:  August 09, 2011  FINDINGS: The heart size and mediastinal contours are stable. The heart size is enlarged. There is no focal infiltrate, pulmonary edema, or pleural effusion. The visualized skeletal structures  are stable.  IMPRESSION: No active cardiopulmonary disease.   Electronically Signed   By: Abelardo Diesel M.D.   On: 07/31/2013 17:22   Ct Head Wo Contrast  07/31/2013   CLINICAL DATA:  Fall last night.  Mental status changes.  Weakness.  EXAM: CT HEAD WITHOUT CONTRAST  CT CERVICAL SPINE WITHOUT CONTRAST  TECHNIQUE: Multidetector CT imaging of the head and cervical spine was performed following the standard protocol without intravenous contrast. Multiplanar CT image reconstructions of the cervical spine were also generated.  COMPARISON:  None  FINDINGS: CT HEAD FINDINGS  Sinuses/Soft tissues: Motion degradation, despite 2 attempts. No gross soft tissue swelling. Mild hyperostosis frontalis interna, without skull fracture. Clear paranasal sinuses and mastoid air cells.  Intracranial: Given motion degradation, no mass lesion, hemorrhage, hydrocephalus, acute infarct, intra-axial, or extra-axial fluid collection.  CT CERVICAL SPINE FINDINGS  Spinal visualization through the bottom of T2. Mild to moderate motion and patient body habitus degradation. Prevertebral soft tissues grossly within normal limits. Dense carotid atherosclerosis bilaterally. No apical pneumothorax. Multilevel spondylosis. This results an neural foraminal narrowing, greater on the left than right. Examples at C4-5 and C5-6.  Skull base grossly intact. Apparent rotation of C1 relative  to C2 is favored to be positional. Vertebral body height grossly maintained. Straightening and mild reversal of expected cervical lordosis. Facets are grossly aligned. Coronal reformats demonstrate a normal C1-C2 articulation.  IMPRESSION: 1. Motion and patient body habitus degraded exams. 2. Given this factor, no convincing evidence of acute fracture or subluxation throughout the cervical spine. No convincing evidence of acute intracranial abnormality. 3. Advanced spondylosis, with areas of bilateral neural foraminal narrowing. 4. Straightening of expected cervical lordosis could be positional, due to muscular spasm, or ligamentous injury.   Electronically Signed   By: Abigail Miyamoto M.D.   On: 07/31/2013 17:54   Ct Cervical Spine Wo Contrast  07/31/2013   CLINICAL DATA:  Fall last night.  Mental status changes.  Weakness.  EXAM: CT HEAD WITHOUT CONTRAST  CT CERVICAL SPINE WITHOUT CONTRAST  TECHNIQUE: Multidetector CT imaging of the head and cervical spine was performed following the standard protocol without intravenous contrast. Multiplanar CT image reconstructions of the cervical spine were also generated.  COMPARISON:  None  FINDINGS: CT HEAD FINDINGS  Sinuses/Soft tissues: Motion degradation, despite 2 attempts. No gross soft tissue swelling. Mild hyperostosis frontalis interna, without skull fracture. Clear paranasal sinuses and mastoid air cells.  Intracranial: Given motion degradation, no mass lesion, hemorrhage, hydrocephalus, acute infarct, intra-axial, or extra-axial fluid collection.  CT CERVICAL SPINE FINDINGS  Spinal visualization through the bottom of T2. Mild to moderate motion and patient body habitus degradation. Prevertebral soft tissues grossly within normal limits. Dense carotid atherosclerosis bilaterally. No apical pneumothorax. Multilevel spondylosis. This results an neural foraminal narrowing, greater on the left than right. Examples at C4-5 and C5-6.  Skull base grossly intact. Apparent  rotation of C1 relative to C2 is favored to be positional. Vertebral body height grossly maintained. Straightening and mild reversal of expected cervical lordosis. Facets are grossly aligned. Coronal reformats demonstrate a normal C1-C2 articulation.  IMPRESSION: 1. Motion and patient body habitus degraded exams. 2. Given this factor, no convincing evidence of acute fracture or subluxation throughout the cervical spine. No convincing evidence of acute intracranial abnormality. 3. Advanced spondylosis, with areas of bilateral neural foraminal narrowing. 4. Straightening of expected cervical lordosis could be positional, due to muscular spasm, or ligamentous injury.   Electronically Signed   By: Adria Devon.D.  On: 07/31/2013 17:54   Dg Abd Portable 1v  08/02/2013   CLINICAL DATA:  Abdominal pain  EXAM: PORTABLE ABDOMEN - 1 VIEW  COMPARISON:  07/04/2013  FINDINGS: Scattered large and small bowel gas is noted. No abnormal mass or worse abnormal calcifications are noted. The bony structures are within normal limits.  IMPRESSION: No acute abnormality seen.   Electronically Signed   By: Inez Catalina M.D.   On: 08/02/2013 11:22    Microbiology: Recent Results (from the past 240 hour(s))  URINE CULTURE     Status: None   Collection Time    07/31/13  4:33 PM      Result Value Range Status   Specimen Description URINE, CATHETERIZED   Final   Special Requests NONE   Final   Culture  Setup Time     Final   Value: 08/01/2013 01:12     Performed at [facility]     Final   Value: NO GROWTH     Performed at [facility]   Culture     Final   Value: NO GROWTH     Performed at [facility]   Report Status 08/01/2013 FINAL   Final     Labs: Basic Metabolic Panel:  Recent Labs Lab 07/31/13 1707 08/01/13 0240 08/02/13 0452  NA 139 139 138  K 4.9 5.0 4.4  CL 105 107 105  CO2 18* 18* 20  GLUCOSE 107* 104* 106*  BUN 38* 38* 40*  CREATININE 4.02* 4.03*  4.30*  CALCIUM 9.7 9.1 8.8  PHOS  --   --  3.4   Liver Function Tests:  Recent Labs Lab 07/31/13 1707 08/01/13 0240 08/02/13 0452  AST 112* 86* 52*  ALT 23 22 19   ALKPHOS 52 43 40  BILITOT 1.3* 1.0 0.4  PROT 7.2 6.3 6.0  ALBUMIN 3.7 3.1* 2.8*  2.8*    Recent Labs Lab 08/02/13 0452  LIPASE 46   No results found for this basename: AMMONIA,  in the last 168 hours CBC:  Recent Labs Lab 07/31/13 1707 08/01/13 0240 08/02/13 0452  WBC 11.2* 8.6 6.9  NEUTROABS 8.8*  --   --   HGB 10.5* 9.6* 9.7*  HCT 32.7* 29.9* 29.1*  MCV 79.6 79.3 79.9  PLT 255 218 229   Cardiac Enzymes:  Recent Labs Lab 07/31/13 1707 08/01/13 0240 08/01/13 0909 08/01/13 1457 08/02/13 0452  CKTOTAL 4896* 2870*  --   --  1394*  TROPONINI  --  0.34* <0.30 <0.30  --    BNP: BNP (last 3 results) No results found for this basename: PROBNP,  in the last 8760 hours CBG:  Recent Labs Lab 08/01/13 0747 08/02/13 0743  GLUCAP 88 99       Signed:  Cheryn Lundquist  [facility] 08/02/2013, 11:43 AM

## 2013-08-02 NOTE — Progress Notes (Signed)
    Consulting cardiologist: Dr. Satira Sark  Please see cardiology consultation note from yesterday. Echocardiogram is pending at this time - will review when available. Troponin I levels completely normal after initial minor increase of 0.34. Still suspect noncardiac musculoskeletal source of increased CK with rhabdomyolysis.   Satira Sark, M.D., F.A.C.C.

## 2013-08-02 NOTE — Clinical Social Work Placement (Signed)
Clinical Social Work Department CLINICAL SOCIAL WORK PLACEMENT NOTE 08/02/2013  Patient:  [name], Snow  Account Number:  [id number] Admit date:  07/31/2013  Clinical Social Worker:  Benay Pike, LCSW  Date/time:  08/01/2013 03:30 PM  Clinical Social Work is seeking post-discharge placement for this patient at the following level of care:   [place]   (*CSW will update this form in Epic as items are completed)   08/01/2013  Patient/family provided with [facility] Department of Clinical Social Work's list of facilities offering this level of care within the geographic area requested by the patient (or if unable, by the patient's family).  08/01/2013  Patient/family informed of their freedom to choose among providers that offer the needed level of care, that participate in Medicare, Medicaid or managed care program needed by the patient, have an available bed and are willing to accept the patient.  08/01/2013  Patient/family informed of [facility]' ownership interest in [facility], as well as of the fact that they are under no obligation to receive care at this facility.  PASARR submitted to [employer] on  PASARR number received from [place] on   FL2 transmitted to all facilities in geographic area requested by pt/family on  08/01/2013 FL2 transmitted to all facilities within larger geographic area on   Patient informed that his/her managed care company has contracts with or will negotiate with  certain facilities, including the following:     Patient/family informed of bed offers received:  08/01/2013 Patient chooses bed at [facility] Physician recommends and patient chooses bed at  [facility]  Patient to be transferred to [facility] on  08/02/2013 Patient to be transferred to facility by [facility]  The following physician request were entered in Epic:   Additional Comments: Pt has existing pasarr number.  Benay Pike,  [place]

## 2013-08-09 ENCOUNTER — Encounter (HOSPITAL_COMMUNITY)
Admission: RE | Admit: 2013-08-09 | Discharge: 2013-08-09 | Disposition: A | Discharge status: Home / Self Care | Payer: Medicare Other | Source: Ambulatory Visit | Attending: Nephrology | Admitting: Nephrology

## 2013-08-09 DIAGNOSIS — D649 Anemia, unspecified: Secondary | ICD-10-CM | POA: Insufficient documentation

## 2013-08-09 DIAGNOSIS — N184 Chronic kidney disease, stage 4 (severe): Secondary | ICD-10-CM | POA: Insufficient documentation

## 2013-08-09 LAB — RENAL FUNCTION PANEL
Albumin: 3.2 g/dL — ABNORMAL LOW (ref 3.5–5.2)
BUN: 64 mg/dL — ABNORMAL HIGH (ref 6–23)
CHLORIDE: 103 meq/L (ref 96–112)
CO2: 18 meq/L — AB (ref 19–32)
Calcium: 9.3 mg/dL (ref 8.4–10.5)
Creatinine, Ser: 4.95 mg/dL — ABNORMAL HIGH (ref 0.50–1.35)
GFR, EST AFRICAN AMERICAN: 12 mL/min — AB (ref >90)
GFR, EST NON AFRICAN AMERICAN: 11 mL/min — AB (ref >90)
GLUCOSE: 111 mg/dL — AB (ref 70–99)
POTASSIUM: 5.2 meq/L (ref 3.7–5.3)
Phosphorus: 4.9 mg/dL — ABNORMAL HIGH (ref 2.3–4.6)
Sodium: 139 mEq/L (ref 137–147)

## 2013-08-09 LAB — HEMOGLOBIN AND HEMATOCRIT, BLOOD
HEMATOCRIT: 32.9 % — AB (ref 39.0–52.0)
Hemoglobin: 10.5 g/dL — ABNORMAL LOW (ref 13.0–17.0)

## 2013-08-09 MED ORDER — EPOETIN ALFA 10000 UNIT/ML IJ SOLN: 8000.0000 [IU] | INTRAMUSCULAR | Status: DC

## 2013-08-09 NOTE — Progress Notes (Signed)
Results for [name] Snow, [name] Snow (MRN [id number]) as of 08/09/2013 10:00  Ref. Range 08/09/2013 09:40  Hemoglobin Latest Range: 13.0-17.0 g/dL 10.5 (L)  HCT Latest Range: 39.0-52.0 % 32.9 (L)  No Procrit for this visit 171/68 HR 75 Sat 100 R/A

## 2013-08-13 ENCOUNTER — Encounter: Payer: Self-pay | Admitting: Family Medicine

## 2013-08-13 DIAGNOSIS — Z0000 Encounter for general adult medical examination without abnormal findings: Secondary | ICD-10-CM | POA: Insufficient documentation

## 2013-08-13 NOTE — Assessment & Plan Note (Signed)
Exam as documented. Of significance, pt is incapable of living independently or in current situation. His health continues to deteriorate. Multiple attempts have been made in the past for this to be  addressed, up to present did not appear that his POA was motivated, but at this visit, she is clearly "burned out " and ready. Pt to go to ED for uncontrolled BP, as well as c/o worsened dyspnea of recent onset

## 2013-09-06 ENCOUNTER — Inpatient Hospital Stay (HOSPITAL_COMMUNITY): Admission: RE | Admit: 2013-09-06 | Payer: Medicare Other | Source: Ambulatory Visit

## 2013-09-06 ENCOUNTER — Ambulatory Visit (HOSPITAL_COMMUNITY): Payer: Medicare Other

## 2013-11-07 ENCOUNTER — Other Ambulatory Visit (INDEPENDENT_AMBULATORY_CARE_PROVIDER_SITE_OTHER): Payer: Self-pay | Admitting: Otolaryngology

## 2013-11-07 DIAGNOSIS — E041 Nontoxic single thyroid nodule: Secondary | ICD-10-CM

## 2013-11-09 ENCOUNTER — Ambulatory Visit (HOSPITAL_COMMUNITY): Payer: Medicare Other

## 2013-12-07 ENCOUNTER — Ambulatory Visit (INDEPENDENT_AMBULATORY_CARE_PROVIDER_SITE_OTHER): Payer: Medicare Other | Admitting: Otolaryngology

## 2013-12-07 DIAGNOSIS — D449 Neoplasm of uncertain behavior of unspecified endocrine gland: Secondary | ICD-10-CM

## 2017-09-13 ENCOUNTER — Other Ambulatory Visit (HOSPITAL_COMMUNITY): Payer: Self-pay | Admitting: Nephrology

## 2017-09-13 DIAGNOSIS — N186 End stage renal disease: Secondary | ICD-10-CM

## 2017-09-14 ENCOUNTER — Other Ambulatory Visit: Payer: Self-pay | Admitting: Radiology

## 2017-09-14 ENCOUNTER — Other Ambulatory Visit: Payer: Self-pay | Admitting: Student

## 2017-09-15 ENCOUNTER — Other Ambulatory Visit (HOSPITAL_COMMUNITY): Payer: Self-pay | Admitting: Nephrology

## 2017-09-15 ENCOUNTER — Other Ambulatory Visit (HOSPITAL_COMMUNITY): Payer: Self-pay | Admitting: Interventional Radiology

## 2017-09-15 ENCOUNTER — Ambulatory Visit (HOSPITAL_COMMUNITY)
Admission: RE | Admit: 2017-09-15 | Discharge: 2017-09-15 | Disposition: A | Discharge status: Home / Self Care | Payer: Medicare Other | Source: Ambulatory Visit | Attending: Nephrology | Admitting: Nephrology

## 2017-09-15 ENCOUNTER — Ambulatory Visit (HOSPITAL_COMMUNITY)
Admission: RE | Admit: 2017-09-15 | Discharge: 2017-09-15 | Disposition: A | Discharge status: Home / Self Care | Payer: Medicare Other | Source: Ambulatory Visit | Attending: Interventional Radiology | Admitting: Interventional Radiology

## 2017-09-15 ENCOUNTER — Telehealth (HOSPITAL_COMMUNITY): Payer: Self-pay

## 2017-09-15 ENCOUNTER — Encounter (HOSPITAL_COMMUNITY): Payer: Self-pay

## 2017-09-15 DIAGNOSIS — N186 End stage renal disease: Secondary | ICD-10-CM

## 2017-09-15 DIAGNOSIS — Z6839 Body mass index (BMI) 39.0-39.9, adult: Secondary | ICD-10-CM | POA: Insufficient documentation

## 2017-09-15 DIAGNOSIS — T82858A Stenosis of vascular prosthetic devices, implants and grafts, initial encounter: Secondary | ICD-10-CM | POA: Diagnosis not present

## 2017-09-15 DIAGNOSIS — Z933 Colostomy status: Secondary | ICD-10-CM | POA: Diagnosis not present

## 2017-09-15 DIAGNOSIS — I120 Hypertensive chronic kidney disease with stage 5 chronic kidney disease or end stage renal disease: Secondary | ICD-10-CM | POA: Diagnosis not present

## 2017-09-15 DIAGNOSIS — Y832 Surgical operation with anastomosis, bypass or graft as the cause of abnormal reaction of the patient, or of later complication, without mention of misadventure at the time of the procedure: Secondary | ICD-10-CM | POA: Diagnosis not present

## 2017-09-15 DIAGNOSIS — E785 Hyperlipidemia, unspecified: Secondary | ICD-10-CM | POA: Insufficient documentation

## 2017-09-15 DIAGNOSIS — Z7982 Long term (current) use of aspirin: Secondary | ICD-10-CM | POA: Diagnosis not present

## 2017-09-15 DIAGNOSIS — T82868A Thrombosis of vascular prosthetic devices, implants and grafts, initial encounter: Secondary | ICD-10-CM | POA: Insufficient documentation

## 2017-09-15 DIAGNOSIS — K861 Other chronic pancreatitis: Secondary | ICD-10-CM | POA: Insufficient documentation

## 2017-09-15 DIAGNOSIS — Z8249 Family history of ischemic heart disease and other diseases of the circulatory system: Secondary | ICD-10-CM | POA: Diagnosis not present

## 2017-09-15 HISTORY — PX: IR US GUIDE VASC ACCESS LEFT: IMG2389

## 2017-09-15 HISTORY — PX: IR THROMBECTOMY AV FISTULA W/THROMBOLYSIS/PTA INC/SHUNT/IMG LEFT: IMG6106

## 2017-09-15 HISTORY — PX: IR DIALY SHUNT INTRO NEEDLE/INTRACATH INITIAL W/IMG LEFT: IMG6102

## 2017-09-15 LAB — CBC
HCT: 33 % — ABNORMAL LOW (ref 39.0–52.0)
Hemoglobin: 10.8 g/dL — ABNORMAL LOW (ref 13.0–17.0)
MCH: 28.8 pg (ref 26.0–34.0)
MCHC: 32.7 g/dL (ref 30.0–36.0)
MCV: 88 fL (ref 78.0–100.0)
Platelets: 301 10*3/uL (ref 150–400)
RBC: 3.75 MIL/uL — AB (ref 4.22–5.81)
RDW: 15.6 % — ABNORMAL HIGH (ref 11.5–15.5)
WBC: 6.6 10*3/uL (ref 4.0–10.5)

## 2017-09-15 LAB — BASIC METABOLIC PANEL
ANION GAP: 18 — AB (ref 5–15)
BUN: 86 mg/dL — ABNORMAL HIGH (ref 6–20)
CHLORIDE: 97 mmol/L — AB (ref 101–111)
CO2: 23 mmol/L (ref 22–32)
Calcium: 8.9 mg/dL (ref 8.9–10.3)
Creatinine, Ser: 17.35 mg/dL — ABNORMAL HIGH (ref 0.61–1.24)
GFR calc non Af Amer: 2 mL/min — ABNORMAL LOW (ref >60)
GFR, EST AFRICAN AMERICAN: 3 mL/min — AB (ref >60)
Glucose, Bld: 97 mg/dL (ref 65–99)
POTASSIUM: 5.8 mmol/L — AB (ref 3.5–5.1)
Sodium: 138 mmol/L (ref 135–145)

## 2017-09-15 LAB — PROTIME-INR
INR: 1.05
Prothrombin Time: 13.6 seconds (ref 11.4–15.2)

## 2017-09-15 MED ORDER — ALTEPLASE 2 MG IJ SOLR
INTRAMUSCULAR | Status: AC | PRN
  Administered 2017-09-15: 4 mg

## 2017-09-15 MED ORDER — MIDAZOLAM HCL 2 MG/2ML IJ SOLN
INTRAMUSCULAR | Status: AC | PRN
  Administered 2017-09-15: 0.5 mg via INTRAVENOUS
  Administered 2017-09-15: 1 mg via INTRAVENOUS
  Administered 2017-09-15: 0.5 mg via INTRAVENOUS

## 2017-09-15 MED ORDER — HEPARIN SODIUM (PORCINE) 1000 UNIT/ML IJ SOLN
INTRAMUSCULAR | Status: AC | PRN
  Administered 2017-09-15: 4000 [IU] via INTRAVENOUS

## 2017-09-15 MED ORDER — IOPAMIDOL (ISOVUE-300) INJECTION 61%
INTRAVENOUS | Status: AC
  Administered 2017-09-15: 10 mL
  Filled 2017-09-15: qty 100

## 2017-09-15 MED ORDER — MIDAZOLAM HCL 2 MG/2ML IJ SOLN
INTRAMUSCULAR | Status: AC
  Filled 2017-09-15: qty 6

## 2017-09-15 MED ORDER — LIDOCAINE HCL (PF) 1 % IJ SOLN
INTRAMUSCULAR | Status: AC | PRN
  Administered 2017-09-15: 10 mL

## 2017-09-15 MED ORDER — HEPARIN SODIUM (PORCINE) 1000 UNIT/ML IJ SOLN
INTRAMUSCULAR | Status: AC
  Filled 2017-09-15: qty 1

## 2017-09-15 MED ORDER — LIDOCAINE HCL 1 % IJ SOLN
INTRAMUSCULAR | Status: AC
  Filled 2017-09-15: qty 20

## 2017-09-15 MED ORDER — SODIUM CHLORIDE 0.9 % IV SOLN: INTRAVENOUS | Status: DC

## 2017-09-15 MED ORDER — FENTANYL CITRATE (PF) 100 MCG/2ML IJ SOLN
INTRAMUSCULAR | Status: AC
  Filled 2017-09-15: qty 4

## 2017-09-15 MED ORDER — FENTANYL CITRATE (PF) 100 MCG/2ML IJ SOLN
INTRAMUSCULAR | Status: AC | PRN
  Administered 2017-09-15 (×2): 25 ug via INTRAVENOUS
  Administered 2017-09-15: 50 ug via INTRAVENOUS

## 2017-09-15 MED ORDER — ALTEPLASE 2 MG IJ SOLR
INTRAMUSCULAR | Status: AC
  Filled 2017-09-15: qty 4

## 2017-09-15 MED ORDER — IOPAMIDOL (ISOVUE-300) INJECTION 61%
INTRAVENOUS | Status: AC
  Administered 2017-09-15: 30 mL
  Filled 2017-09-15: qty 100

## 2017-09-15 NOTE — Sedation Documentation (Signed)
Patient is resting comfortably. 

## 2017-09-15 NOTE — Sedation Documentation (Signed)
Pt at RN station awaiting procedure

## 2017-09-15 NOTE — Progress Notes (Addendum)
Mod amount of bleeding from left arm fistula site, pressure held 7 minutes with palpable beat per 2 nurses, dressing removed for visualization, Dr Pascal Lux was paged. After assessment Dr Pascal Lux took pt back to radiology.

## 2017-09-15 NOTE — Progress Notes (Signed)
When pt left to be rechecked by radiology the driver left saying she had to [facility], she took the pts w/c and belongings. Radiology was to call ptar

## 2017-09-15 NOTE — Procedures (Signed)
Technically successful declot of left forearm dialysis graft.   EBL: Trace No immediate post procedural complications. Access is ready for immediate use.  Ronny Bacon, MD Pager #: [phone]

## 2017-09-15 NOTE — H&P (Signed)
Chief Complaint: Patient was seen in consultation today for clotted AV graft  Referring Physician(s): Fran Lowes  Supervising Physician: Sandi Mariscal  Patient Status: [facility] - Out-pt  History of Present Illness: [name] Snow is a 78 y.o. male with past medical history of HTN, HLD, alcohol abuse, pancreatitis, and chronic kidney disease who presents with malfunction of his left AV graft.  Per patient, he was last able to undergo dialysis on Monday, but states there was significant difficulty with his graft.  He is not sure whether or not he completed dialysis on that day.  He presents today for declotting procedure of his left AVG. He has been NPO.  He does not take blood thinners.   Past Medical History:  Diagnosis Date  . Alcohol abuse   . Chronic pancreatitis ([place])   . Colostomy in place [facility])   . Dialysis care   . Hyperlipidemia   . Hypertension   . Intestinal obstruction ([place])   . Metabolic acidosis 04/06/4966  . Morbid obesity ([place])     Past Surgical History:  Procedure Laterality Date  . COLECTOMY    . Partial toenail removal      Allergies: Patient has no known allergies.  Medications: Prior to Admission medications   Medication Sig Start Date End Date Taking? Authorizing Provider  allopurinol (ZYLOPRIM) 300 MG tablet Take 300 mg by mouth daily.   Yes [provider]  aspirin EC 81 MG tablet Take 81 mg by mouth daily.   Yes [provider]  midodrine (PROAMATINE) 2.5 MG tablet Take 2.5 mg by mouth 3 (three) times daily with meals.   Yes [provider]  multivitamin (RENA-VIT) TABS tablet Take 1 tablet by mouth daily.   Yes [provider]  nitroGLYCERIN (NITROSTAT) 0.4 MG SL tablet Place 0.4 mg under the tongue every 5 (five) minutes as needed for chest pain.   Yes [provider]  Protein POWD Take 1 packet by mouth 2 (two) times daily.   Yes [provider]  sevelamer carbonate (RENVELA) 800 MG  tablet Take 1,600-3,200 mg by mouth See admin instructions. Take 3200 mg by mouth 3 times daily with meals. Take 1600 mg by mouth at bedtime   Yes [provider]  sodium bicarbonate 650 MG tablet Take 650 mg by mouth 3 (three) times daily.   Yes [provider]  albuterol (PROVENTIL) (2.5 MG/3ML) 0.083% nebulizer solution Take 3 mLs (2.5 mg total) by nebulization every 6 (six) hours as needed for wheezing or shortness of breath. Patient not taking: Reported on 09/14/2017 08/02/13   Dhungel, Flonnie Overman, MD  cloNIDine (CATAPRES) 0.3 MG tablet Take 1 tablet (0.3 mg total) by mouth 3 (three) times daily. Patient not taking: Reported on 09/14/2017 08/02/13   Dhungel, Flonnie Overman, MD  furosemide (LASIX) 80 MG tablet Take 1 tablet (80 mg total) by mouth 2 (two) times daily. Patient not taking: Reported on 09/14/2017 08/02/13   Dhungel, Flonnie Overman, MD  hydrALAZINE (APRESOLINE) 25 MG tablet Take 1 tablet (25 mg total) by mouth every 8 (eight) hours. 08/02/13   Dhungel, Flonnie Overman, MD  isosorbide mononitrate (IMDUR) 30 MG 24 hr tablet Take 1 tablet (30 mg total) by mouth daily. 08/02/13   Dhungel, Flonnie Overman, MD  metoprolol succinate (TOPROL-XL) 100 MG 24 hr tablet Take 1 tablet (100 mg total) by mouth daily. Take with or immediately following a meal. 08/02/13   Dhungel, Nishant, MD  NIFEdipine (PROCARDIA XL/ADALAT-CC) 90 MG 24 hr tablet Take 1 tablet (90 mg  total) by mouth daily. 08/02/13   Dhungel, Flonnie Overman, MD     Family History  Problem Relation Age of Onset  . Hypertension Sister   . Hypertension Mother   . Hypertension Father     Social History   Socioeconomic History  . Marital status: Single    Spouse name: Not on file  . Number of children: Not on file  . Years of education: Not on file  . Highest education level: Not on file  Social Needs  . Financial resource strain: Not on file  . Food insecurity - worry: Not on file  . Food insecurity - inability: Not on file  . Transportation  needs - medical: Not on file  . Transportation needs - non-medical: Not on file  Occupational History  . Occupation: retired - custodian   Tobacco Use  . Smoking status: Never Smoker  Substance and Sexual Activity  . Alcohol use: No  . Drug use: No  . Sexual activity: Not Currently  Other Topics Concern  . Not on file  Social History Narrative  . Not on file     Review of Systems: A 12 point ROS discussed and pertinent positives are indicated in the HPI above.  All other systems are negative.  Review of Systems  Constitutional: Negative for fatigue and fever.  Respiratory: Negative for cough and shortness of breath.   Cardiovascular: Negative for chest pain.  Gastrointestinal: Negative for abdominal pain.  Psychiatric/Behavioral: Negative for behavioral problems and confusion.    Vital Signs: BP (!) 126/58   Temp 97.6 F (36.4 C) (Oral)   Resp 16   Ht 5' 6.5" (1.689 m)   Wt 250 lb (113.4 kg)   SpO2 100%   BMI 39.75 kg/m   Physical Exam  Constitutional: He is oriented to person, place, and time. He appears well-developed.  Cardiovascular: Normal rate, regular rhythm and normal heart sounds.  Pulmonary/Chest: Effort normal and breath sounds normal. No respiratory distress.  Neurological: He is alert and oriented to person, place, and time.  Patient is alert and oriented but with limited insight into medical status likely due to knowledge barrier.  He is able to answer questions appropriately and verifiably.   Skin:  L forearm AVG, no thrill  Psychiatric: He has a normal mood and affect. His behavior is normal. Judgment and thought content normal.  Nursing note and vitals reviewed.    MD Evaluation Airway: WNL Heart: WNL Abdomen: WNL Chest/ Lungs: WNL ASA  Classification: 3 Mallampati/Airway Score: Two   Imaging: No results found.  Labs:  CBC: No results for input(s): WBC, HGB, HCT, PLT in the last 8760 hours.  COAGS: No results for input(s): INR,  APTT in the last 8760 hours.  BMP: No results for input(s): NA, K, CL, CO2, GLUCOSE, BUN, CALCIUM, CREATININE, GFRNONAA, GFRAA in the last 8760 hours.  Invalid input(s): CMP  LIVER FUNCTION TESTS: No results for input(s): BILITOT, AST, ALT, ALKPHOS, PROT, ALBUMIN in the last 8760 hours.  TUMOR MARKERS: No results for input(s): AFPTM, CEA, CA199, CHROMGRNA in the last 8760 hours.  Assessment and Plan: Patient with past medical history of renal failure on dialsys presents with complaint of clotted left AV graft.  IR consulted for declotting procedure at the request of Dr. Lowanda Foster.  Patient presents today in their usual state of health.  He has been NPO and is not currently on blood thinners.  Risks and benefits discussed with the patient including, but not limited to bleeding, infection,  vascular injury, pulmonary embolism, need for tunneled HD catheter placement or even death.  All of the patient's questions were answered, patient is agreeable to proceed. Consent signed and in chart.  Thank you for this interesting consult.  I greatly enjoyed meeting [name] Snow and look forward to participating in their care.  A copy of this report was sent to the requesting provider on this date.  Electronically Signed: Docia Barrier, PA 09/15/2017, 8:24 AM   I spent a total of  30 Minutes   in face to face in clinical consultation, greater than 50% of which was counseling/coordinating care for clotted AV graft.

## 2017-09-15 NOTE — Discharge Instructions (Signed)
Fistulogram, Care After °Refer to this sheet in the next few weeks. These instructions provide you with information on caring for yourself after your procedure. Your health care provider may also give you more specific instructions. Your treatment has been planned according to current medical practices, but problems sometimes occur. Call your health care provider if you have any problems or questions after your procedure. °What can I expect after the procedure? °After your procedure, it is typical to have the following: °· A small amount of discomfort in the area where the catheters were placed. °· A small amount of bruising around the fistula. °· Sleepiness and fatigue. ° °Follow these instructions at home: °· Rest at home for the day following your procedure. °· Do not drive or operate heavy machinery while taking pain medicine. °· Take medicines only as directed by your health care provider. °· Do not take baths, swim, or use a hot tub until your health care provider approves. You may shower 24 hours after the procedure or as directed by your health care provider. °· There are many different ways to close and cover an incision, including stitches, skin glue, and adhesive strips. Follow your health care provider's instructions on: °? Incision care. °? Bandage (dressing) changes and removal. °? Incision closure removal. °· Monitor your dialysis fistula carefully. °Contact a health care provider if: °· You have drainage, redness, swelling, or pain at your catheter site. °· You have a fever. °· You have chills. °Get help right away if: °· You feel weak. °· You have trouble balancing. °· You have trouble moving your arms or legs. °· You have problems with your speech or vision. °· You can no longer feel a vibration or buzz when you put your fingers over your dialysis fistula. °· The limb that was used for the procedure: °? Swells. °? Is painful. °? Is cold. °? Is discolored, such as blue or pale white. °This  information is not intended to replace advice given to you by your health care provider. Make sure you discuss any questions you have with your health care provider. °Document Released: 12/04/2013 Document Revised: 12/26/2015 Document Reviewed: 09/08/2013 °Elsevier Interactive Patient Education © 2018 [employer] ° ° °Moderate Conscious Sedation, Adult, Care After °These instructions provide you with information about caring for yourself after your procedure. Your health care provider may also give you more specific instructions. Your treatment has been planned according to current medical practices, but problems sometimes occur. Call your health care provider if you have any problems or questions after your procedure. °What can I expect after the procedure? °After your procedure, it is common: °· To feel sleepy for several hours. °· To feel clumsy and have poor balance for several hours. °· To have poor judgment for several hours. °· To vomit if you eat too soon. ° °Follow these instructions at home: °For at least 24 hours after the procedure: ° °· Do not: °? Participate in activities where you could fall or become injured. °? Drive. °? Use heavy machinery. °? Drink alcohol. °? Take sleeping pills or medicines that cause drowsiness. °? Make important decisions or sign legal documents. °? Take care of children on your own. °· Rest. °Eating and drinking °· Follow the diet recommended by your health care provider. °· If you vomit: °? Drink water, juice, or soup when you can drink without vomiting. °? Make sure you have little or no nausea before eating solid foods. °General instructions °· Have a responsible adult stay   with you until you are awake and alert. °· Take over-the-counter and prescription medicines only as told by your health care provider. °· If you smoke, do not smoke without supervision. °· Keep all follow-up visits as told by your health care provider. This is important. °Contact a health care  provider if: °· You keep feeling nauseous or you keep vomiting. °· You feel light-headed. °· You develop a rash. °· You have a fever. °Get help right away if: °· You have trouble breathing. °This information is not intended to replace advice given to you by your health care provider. Make sure you discuss any questions you have with your health care provider. °Document Released: 05/10/2013 Document Revised: 12/23/2015 Document Reviewed: 11/09/2015 °Elsevier Interactive Patient Education © 2018 [employer] ° °

## 2018-02-10 NOTE — Telephone Encounter (Signed)
Signing past encounter of attempted phone call 

## 2018-08-12 ENCOUNTER — Encounter (HOSPITAL_COMMUNITY): Payer: Self-pay | Admitting: Emergency Medicine

## 2018-08-12 ENCOUNTER — Emergency Department (HOSPITAL_COMMUNITY)
Admission: EM | Admit: 2018-08-12 | Discharge: 2018-08-13 | Disposition: A | Discharge status: Home / Self Care | Payer: Medicare Other | Attending: Emergency Medicine | Admitting: Emergency Medicine

## 2018-08-12 ENCOUNTER — Emergency Department (HOSPITAL_COMMUNITY): Payer: Medicare Other

## 2018-08-12 DIAGNOSIS — Z992 Dependence on renal dialysis: Secondary | ICD-10-CM | POA: Insufficient documentation

## 2018-08-12 DIAGNOSIS — Z79899 Other long term (current) drug therapy: Secondary | ICD-10-CM | POA: Diagnosis not present

## 2018-08-12 DIAGNOSIS — I132 Hypertensive heart and chronic kidney disease with heart failure and with stage 5 chronic kidney disease, or end stage renal disease: Secondary | ICD-10-CM | POA: Insufficient documentation

## 2018-08-12 DIAGNOSIS — F1010 Alcohol abuse, uncomplicated: Secondary | ICD-10-CM | POA: Diagnosis not present

## 2018-08-12 DIAGNOSIS — Z7982 Long term (current) use of aspirin: Secondary | ICD-10-CM | POA: Diagnosis not present

## 2018-08-12 DIAGNOSIS — R109 Unspecified abdominal pain: Secondary | ICD-10-CM | POA: Diagnosis present

## 2018-08-12 DIAGNOSIS — R001 Bradycardia, unspecified: Secondary | ICD-10-CM | POA: Diagnosis not present

## 2018-08-12 DIAGNOSIS — N186 End stage renal disease: Secondary | ICD-10-CM

## 2018-08-12 DIAGNOSIS — I5030 Unspecified diastolic (congestive) heart failure: Secondary | ICD-10-CM | POA: Diagnosis not present

## 2018-08-12 LAB — CBC WITH DIFFERENTIAL/PLATELET
ABS IMMATURE GRANULOCYTES: 0.03 10*3/uL (ref 0.00–0.07)
BASOS ABS: 0 10*3/uL (ref 0.0–0.1)
Basophils Relative: 0 %
EOS PCT: 5 %
Eosinophils Absolute: 0.4 10*3/uL (ref 0.0–0.5)
HEMATOCRIT: 27.8 % — AB (ref 39.0–52.0)
Hemoglobin: 8.5 g/dL — ABNORMAL LOW (ref 13.0–17.0)
Immature Granulocytes: 0 %
LYMPHS ABS: 1.6 10*3/uL (ref 0.7–4.0)
LYMPHS PCT: 22 %
MCH: 27.3 pg (ref 26.0–34.0)
MCHC: 30.6 g/dL (ref 30.0–36.0)
MCV: 89.4 fL (ref 80.0–100.0)
MONO ABS: 0.9 10*3/uL (ref 0.1–1.0)
Monocytes Relative: 13 %
NEUTROS ABS: 4.3 10*3/uL (ref 1.7–7.7)
Neutrophils Relative %: 60 %
Platelets: 292 10*3/uL (ref 150–400)
RBC: 3.11 MIL/uL — ABNORMAL LOW (ref 4.22–5.81)
RDW: 15.9 % — ABNORMAL HIGH (ref 11.5–15.5)
WBC: 7.2 10*3/uL (ref 4.0–10.5)
nRBC: 0 % (ref 0.0–0.2)

## 2018-08-12 LAB — LIPASE, BLOOD: Lipase: 59 U/L — ABNORMAL HIGH (ref 11–51)

## 2018-08-12 LAB — ETHANOL: Alcohol, Ethyl (B): <10 mg/dL (ref <10)

## 2018-08-12 LAB — COMPREHENSIVE METABOLIC PANEL
ALBUMIN: 2.6 g/dL — AB (ref 3.5–5.0)
ALK PHOS: 45 U/L (ref 38–126)
ALT: 7 U/L (ref 0–44)
AST: 8 U/L — AB (ref 15–41)
Anion gap: 18 — ABNORMAL HIGH (ref 5–15)
BUN: 101 mg/dL — ABNORMAL HIGH (ref 8–23)
CALCIUM: 8.2 mg/dL — AB (ref 8.9–10.3)
CO2: 22 mmol/L (ref 22–32)
CREATININE: 21.21 mg/dL — AB (ref 0.61–1.24)
Chloride: 92 mmol/L — ABNORMAL LOW (ref 98–111)
GFR calc Af Amer: 2 mL/min — ABNORMAL LOW (ref >60)
GFR calc non Af Amer: 2 mL/min — ABNORMAL LOW (ref >60)
GLUCOSE: 111 mg/dL — AB (ref 70–99)
Potassium: 5.1 mmol/L (ref 3.5–5.1)
Sodium: 132 mmol/L — ABNORMAL LOW (ref 135–145)
Total Bilirubin: 0.3 mg/dL (ref 0.3–1.2)
Total Protein: 6.2 g/dL — ABNORMAL LOW (ref 6.5–8.1)

## 2018-08-12 LAB — I-STAT TROPONIN, ED: Troponin i, poc: 0.06 ng/mL (ref 0.00–0.08)

## 2018-08-12 NOTE — ED Notes (Signed)
Report given to [facility] @ [facility]

## 2018-08-12 NOTE — ED Triage Notes (Signed)
Pt to ER from [facility] in [place] for complaint of fistula pain with redness and swelling. Pt also complaining of chest discomfort. Has only had 1 hour of dialysis this week. VSS.

## 2018-08-12 NOTE — ED Notes (Signed)
Colostomy bag changed.  Tolerated well.

## 2018-08-12 NOTE — ED Notes (Signed)
Upon rounding on patient found ostomy bag full and leaking.  A11 sent for new supplies.

## 2018-08-12 NOTE — ED Provider Notes (Signed)
  Physical Exam  BP (!) 163/67   Pulse 65   Temp 99.2 F (37.3 C) (Oral)   Resp 15   SpO2 99%   Physical Exam  ED Course/Procedures     Procedures  MDM  Care assumed at 4 pm.  Patient is a dialysis patient apparently had multiple complaints. Initially there was possible dialysis graft problem but Dr. Rex Kras noted that the graft seems to function fine. He has second degree heart block and trop was negative and had no chest pain or syncope. Had some abdominal pain so CT ab/pel was pending at sign out.   6:53 PM CT unremarkable. 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 No widened mediastinum on CXR. Stable for discharge. K is normal, doesn't need emergent dialysis.     Drenda Freeze, MD 08/12/18 [phone]

## 2018-08-12 NOTE — ED Notes (Signed)
Called [facility] for pt transport  

## 2018-08-12 NOTE — ED Notes (Signed)
Attempted to call [employer]

## 2018-08-12 NOTE — ED Notes (Signed)
Pt refused anything to eat or drink.

## 2018-08-12 NOTE — ED Notes (Signed)
[facility] called  

## 2018-08-12 NOTE — Discharge Instructions (Addendum)
STOP TAKING METOPROLOL IF YOU ARE ON THIS MEDICATION   Continue your current meds  Get dialysis as scheduled   See your doctor  Return to ER if you have worse abdominal pain, chest pain, passing out, trouble breathing

## 2018-08-12 NOTE — ED Notes (Signed)
Dr. Yao at bedside. 

## 2018-08-12 NOTE — ED Provider Notes (Signed)
[place] [facility] EMERGENCY DEPARTMENT Provider Note   CSN: [id number] Arrival date & time: 08/12/18  1131     History   Chief Complaint Chief Complaint  Patient presents with  . Vascular Access Problem    HPI [name] Snow is a 78 y.o. male.  78 year old male with extensive past medical history including ESRD on HD, colostomy, alcohol abuse, hypertension who presents with abdominal pain.  History is extremely limited as the patient is hard of hearing and cannot understand most of my questions.  The initial report was that he came for pain and redness at the site of his AV fistula.  However, he denies any pain in this area for me.  He states that he is here for abdominal pain.  For me, he denies any chest pain.  He has not had any vomiting and has had normal output in his ostomy. He told triage that he has only had 1 hour of dialysis this week. I asked him about being at a [facility] facility on 1/8 but he is not able to tell me anything about it.   LEVEL 5 CAVEAT DUE TO HARD OF HEARING  The history is provided by the patient. The history is limited by a language barrier.    Past Medical History:  Diagnosis Date  . Alcohol abuse   . Chronic pancreatitis ([place])   . Colostomy in place [facility])   . Dialysis care   . Hyperlipidemia   . Hypertension   . Intestinal obstruction ([place])   . Metabolic acidosis 4/0/9811  . Morbid obesity [facility])     Patient Active Problem List   Diagnosis Date Noted  . Encounter for Medicare annual wellness exam 08/13/2013  . Diastolic CHF ([place]) 91/47/8295  . Fall at home 08/01/2013  . Chest pain 08/01/2013  . Bilateral lower extremity edema 08/01/2013  . HTN (hypertension), malignant 08/01/2013  . History of colostomy 08/01/2013  . Cellulitis of leg, right 08/01/2013  . Anemia in chronic kidney disease 08/01/2013  . Rhabdomyolysis 07/31/2013  . 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 Chronic kidney disease, stage IV (severe) ([place]) 12/06/2012  . Venous  stasis of lower extremity 11/13/2012  . Unspecified hypothyroidism 11/07/2012  . Elevated parathyroid hormone 06/06/2012  . Onychomycosis 02/09/2012  . Metabolic acidosis 62/13/0865  . Atelectasis 08/10/2011  . SBO (small bowel obstruction) ([place]) 08/05/2011  . Anemia 06/29/2011  . FATIGUE 12/26/2009  . HYPERURICEMIA 08/28/2009  . OTH&UNSPEC SUPERFICIAL INJURY FACE NECK&SCLP INF 05/31/2008  . HYPERLIPIDEMIA 11/01/2007  . OBESITY, MORBID 11/01/2007  . HYPERTENSION 11/01/2007  . INTESTINAL OBSTRUCTION 11/01/2007  . PANCREATITIS, CHRONIC 11/01/2007  . ALCOHOL ABUSE, HX OF 11/01/2007    Past Surgical History:  Procedure Laterality Date  . COLECTOMY    . IR DIALY SHUNT INTRO NEEDLE/INTRACATH INITIAL W/IMG LEFT Left 09/15/2017  . IR THROMBECTOMY AV FISTULA W/THROMBOLYSIS/PTA INC/SHUNT/IMG LEFT Left 09/15/2017  . IR US GUIDE VASC ACCESS LEFT  09/15/2017  . IR US GUIDE VASC ACCESS LEFT  09/15/2017  . Partial toenail removal          Home Medications    Prior to Admission medications   Medication Sig Start Date End Date Taking? Authorizing Provider  acetaminophen (TYLENOL) 650 MG CR tablet Take 650 mg by mouth every 8 (eight) hours as needed for pain.   Yes [provider]  allopurinol (ZYLOPRIM) 300 MG tablet Take 300 mg by mouth daily.   Yes [provider]  alum & mag hydroxide-simeth (MYLANTA) 200-200-20 MG/5ML suspension  Take 15 mLs by mouth every 2 (two) hours as needed for indigestion or heartburn. 6 doses maximum in 24 hours   Yes [provider]  aspirin EC 81 MG tablet Take 81 mg by mouth daily.   Yes [provider]  midodrine (PROAMATINE) 2.5 MG tablet Take 2.5 mg by mouth 3 (three) times daily with meals.   Yes [provider]  multivitamin (RENA-VIT) TABS tablet Take 1 tablet by mouth daily.   Yes [provider]  pantoprazole (PROTONIX) 40 MG tablet Take 40 mg by mouth daily.   Yes [provider]  Protein POWD  Take 1 packet by mouth 2 (two) times daily.   Yes [provider]  sevelamer carbonate (RENVELA) 800 MG tablet Take 1,600-3,200 mg by mouth See admin instructions. Take 3200 mg by mouth 3 times daily with meals. Take 1600 mg by mouth at bedtime   Yes [provider]  sodium bicarbonate 650 MG tablet Take 650 mg by mouth 3 (three) times daily.   Yes [provider]  hydrALAZINE (APRESOLINE) 25 MG tablet Take 1 tablet (25 mg total) by mouth every 8 (eight) hours. Patient not taking: Reported on 08/12/2018 08/02/13   Dhungel, Flonnie Overman, MD  isosorbide mononitrate (IMDUR) 30 MG 24 hr tablet Take 1 tablet (30 mg total) by mouth daily. Patient not taking: Reported on 08/12/2018 08/02/13   Dhungel, Flonnie Overman, MD  metoprolol succinate (TOPROL-XL) 100 MG 24 hr tablet Take 1 tablet (100 mg total) by mouth daily. Take with or immediately following a meal. Patient not taking: Reported on 08/12/2018 08/02/13   Dhungel, Flonnie Overman, MD  NIFEdipine (PROCARDIA XL/ADALAT-CC) 90 MG 24 hr tablet Take 1 tablet (90 mg total) by mouth daily. Patient not taking: Reported on 08/12/2018 08/02/13   Louellen Molder, MD    Family History Family History  Problem Relation Age of Onset  . Hypertension Sister   . Hypertension Mother   . Hypertension Father     Social History Social History   Tobacco Use  . Smoking status: Never Smoker  . Smokeless tobacco: Never Used  Substance Use Topics  . Alcohol use: No  . Drug use: No     Allergies   Patient has no known allergies.   Review of Systems Review of Systems  Unable to perform ROS: Other  extremely hard of hearing   Physical Exam Updated Vital Signs BP (!) 151/52   Pulse 64   Temp 99.2 F (37.3 C) (Oral)   Resp 19   SpO2 100%   Physical Exam Vitals signs and nursing note reviewed.  Constitutional:      General: He is not in acute distress.    Appearance: He is well-developed.  HENT:     Head: Normocephalic and atraumatic.      Nose: Congestion present.  Eyes:     Conjunctiva/sclera: Conjunctivae normal.  Neck:     Musculoskeletal: Neck supple.  Cardiovascular:     Rate and Rhythm: Normal rate and regular rhythm.     Heart sounds: Normal heart sounds. No murmur.  Pulmonary:     Effort: Pulmonary effort is normal.     Breath sounds: Normal breath sounds.  Abdominal:     General: Bowel sounds are normal. There is no distension.     Palpations: Abdomen is soft.     Comments: Mild generalized TTP, colostomy in place w/ normal appearing liquid stool  Musculoskeletal:     Right lower leg: No edema.  Left lower leg: No edema.  Skin:    General: Skin is warm and dry.     Comments: AV fistula L forearm with no surrounding redness, drainage, or tenderness; palpable thrill  Neurological:     Mental Status: He is alert and oriented to person, place, and time.     Comments: Fluent speech  Psychiatric:        Judgment: Judgment normal.      ED Treatments / Results  Labs (all labs ordered are listed, but only abnormal results are displayed) Labs Reviewed  COMPREHENSIVE METABOLIC PANEL - Abnormal; Notable for the following components:      Result Value   Sodium 132 (*)    Chloride 92 (*)    Glucose, Bld 111 (*)    BUN 101 (*)    Creatinine, Ser 21.21 (*)    Calcium 8.2 (*)    Total Protein 6.2 (*)    Albumin 2.6 (*)    AST 8 (*)    GFR calc non Af Amer 2 (*)    GFR calc Af Amer 2 (*)    Anion gap 18 (*)    All other components within normal limits  LIPASE, BLOOD - Abnormal; Notable for the following components:   Lipase 59 (*)    All other components within normal limits  CBC WITH DIFFERENTIAL/PLATELET - Abnormal; Notable for the following components:   RBC 3.11 (*)    Hemoglobin 8.5 (*)    HCT 27.8 (*)    RDW 15.9 (*)    All other components within normal limits  ETHANOL  I-STAT TROPONIN, ED    EKG EKG Interpretation  Date/Time:  Friday August 12 2018 12:36:51 EST Ventricular Rate:    67 PR Interval:    QRS Duration: 100 QT Interval:  446 QTC Calculation: 471 R Axis:   -8 Text Interpretation:  Second degree AV block, Mobitz II Probable left atrial enlargement occasional dropped beat similar to previous Confirmed by Theotis Burrow [phone]) on 08/12/2018 1:45:45 PM   Radiology Dg Chest 2 View  Result Date: 08/12/2018 CLINICAL DATA:  Redness and swelling EXAM: CHEST - 2 VIEW COMPARISON:  07/31/2013 FINDINGS: Cardiac shadow is enlarged. Aortic calcifications are noted. No focal infiltrate or effusion is seen. No acute bony abnormality is noted. IMPRESSION: No active cardiopulmonary disease. Electronically Signed   By: Inez Catalina M.D.   On: 08/12/2018 13:08    Procedures Procedures (including critical care time)  Medications Ordered in ED Medications - No data to display   Initial Impression / Assessment and Plan / ED Course  I have reviewed the triage vital signs and the nursing notes.  Pertinent labs & imaging results that were available during my care of the patient were reviewed by me and considered in my medical decision making (see chart for details).     Patient is comfortable on exam with normal-appearing stool in colostomy bag.  Vital signs reassuring, normal O2 saturation.  His lab work shows reassuring potassium at 5.1 with no EKG changes hyperkalemia.  He does have occasional dropped QRS complex on EKG, possible Mobitz type II.  His heart rate has been reassuring here.  I discussed with cardiology, Dr. Burt Knack, who did not feel that this incidental finding required acute intervention but he did recommend discontinuing metoprolol.  I will have the patient follow-up with his primary provider for reassessment off of the metoprolol.  It is been difficult to understand what actually brought the patient to the ED.  He has no evidence of infection, redness, or tenderness at his AV fistula site.  He has also denied any chest pain for me although I have ordered a  troponin which is pending.  His chest x-ray is clear and he has no findings to warrant emergent dialysis today. I have ordered CT abd/pelvis because of report of abdominal pain; labs are reassuring.  I am signing out to the oncoming provider pending imaging results.  The patient has paperwork from an outside facility dated 08/10/2018 that mentions discharge after ED work up for abdominal pain but the paperwork is incomplete and I cannot find any documentation of this in his chart in epic.  The papers suggest that he has previously been worked up for the abdominal pain.  I anticipate that he will be safe for discharge home if his imaging is reassuring. Final Clinical Impressions(s) / ED Diagnoses   Final diagnoses:  None    ED Discharge Orders    None       Little, Wenda Overland, MD 08/12/18 1623

## 2018-08-13 NOTE — ED Notes (Signed)
Leaving with [facility] at this time.  Report given to facility by previous shift.

## 2018-08-23 ENCOUNTER — Other Ambulatory Visit: Payer: Self-pay

## 2018-08-23 ENCOUNTER — Encounter: Payer: Self-pay | Admitting: Family

## 2018-08-23 ENCOUNTER — Encounter: Payer: Medicaid Other | Admitting: Family

## 2018-08-23 ENCOUNTER — Inpatient Hospital Stay (HOSPITAL_COMMUNITY): Admission: RE | Admit: 2018-08-23 | Payer: Medicaid Other | Source: Ambulatory Visit

## 2018-08-23 DIAGNOSIS — N184 Chronic kidney disease, stage 4 (severe): Secondary | ICD-10-CM

## 2018-09-03 DEATH — deceased
